# Patient Record
Sex: Female | Born: 1985 | Race: White | Hispanic: No | Marital: Married | Smoking: Never smoker
Health system: Southern US, Community
[De-identification: ages and names within clinical notes are randomized; demographics above are authoritative.]

## PROBLEM LIST (undated history)

## (undated) DIAGNOSIS — T7840XA Allergy, unspecified, initial encounter: Secondary | ICD-10-CM

## (undated) DIAGNOSIS — H469 Unspecified optic neuritis: Secondary | ICD-10-CM

## (undated) DIAGNOSIS — F419 Anxiety disorder, unspecified: Secondary | ICD-10-CM

## (undated) DIAGNOSIS — G709 Myoneural disorder, unspecified: Secondary | ICD-10-CM

## (undated) HISTORY — PX: TONSILLECTOMY AND ADENOIDECTOMY: SUR1326

## (undated) HISTORY — DX: Allergy, unspecified, initial encounter: T78.40XA

## (undated) HISTORY — PX: WISDOM TOOTH EXTRACTION: SHX21

## (undated) HISTORY — DX: Unspecified optic neuritis: H46.9

## (undated) HISTORY — DX: Myoneural disorder, unspecified: G70.9

## (undated) HISTORY — DX: Anxiety disorder, unspecified: F41.9

---

## 2011-10-21 DIAGNOSIS — D66 Hereditary factor VIII deficiency: Secondary | ICD-10-CM | POA: Insufficient documentation

## 2015-09-23 DIAGNOSIS — Z8669 Personal history of other diseases of the nervous system and sense organs: Secondary | ICD-10-CM | POA: Insufficient documentation

## 2017-08-15 LAB — CBC AND DIFFERENTIAL
HCT: 42 (ref 36–46)
Hemoglobin: 14.4 (ref 12.0–16.0)
Platelets: 213 (ref 150–399)
WBC: 5.7

## 2017-08-15 LAB — BASIC METABOLIC PANEL
BUN: 10 (ref 4–21)
CO2: 27 — AB (ref 13–22)
Chloride: 105 (ref 99–108)
Creatinine: 0.6 (ref 0.5–1.1)
Glucose: 87
Potassium: 3.8 (ref 3.4–5.3)
Sodium: 140 (ref 137–147)

## 2017-08-15 LAB — HEPATIC FUNCTION PANEL
ALT: 16 (ref 7–35)
AST: 19 (ref 13–35)
Alkaline Phosphatase: 75 (ref 25–125)
Bilirubin, Total: 0.6

## 2017-08-15 LAB — LIPID PANEL
Cholesterol: 137 (ref 0–200)
HDL: 43 (ref 35–70)
LDL Cholesterol: 84
Triglycerides: 49 (ref 40–160)

## 2017-08-15 LAB — TSH: TSH: 1.25 (ref 0.41–5.90)

## 2017-08-15 LAB — COMPREHENSIVE METABOLIC PANEL: Calcium: 8.7 (ref 8.7–10.7)

## 2017-08-15 LAB — CBC: RBC: 4.62 (ref 3.87–5.11)

## 2019-01-02 ENCOUNTER — Ambulatory Visit (INDEPENDENT_AMBULATORY_CARE_PROVIDER_SITE_OTHER): Payer: 59 | Admitting: Adult Health

## 2019-01-02 ENCOUNTER — Other Ambulatory Visit: Payer: Self-pay

## 2019-01-02 ENCOUNTER — Encounter: Payer: Self-pay | Admitting: Adult Health

## 2019-01-02 VITALS — BP 117/79 | HR 70 | Temp 98.8°F | Ht 62.5 in | Wt 182.0 lb

## 2019-01-02 DIAGNOSIS — Z87898 Personal history of other specified conditions: Secondary | ICD-10-CM | POA: Diagnosis not present

## 2019-01-02 DIAGNOSIS — Z0000 Encounter for general adult medical examination without abnormal findings: Secondary | ICD-10-CM | POA: Diagnosis not present

## 2019-01-02 DIAGNOSIS — Z6832 Body mass index (BMI) 32.0-32.9, adult: Secondary | ICD-10-CM

## 2019-01-02 NOTE — Assessment & Plan Note (Signed)
Increase water intake, strive for at least 90 ounces/day.   Follow Heart Healthy diet Increase regular exercise.  Recommend at least 30 minutes daily, 5 days per week of walking, jogging, biking, swimming, YouTube/[employer] workout videos. Referral to OB/GYN placed. Referral to Sleep Study placed. Please schedule fasting lab appt in the next 2 weeks. Recommend annual physical.

## 2019-01-02 NOTE — Patient Instructions (Signed)
Preventive Care for Adults, Female  A healthy lifestyle and preventive care can promote health and wellness. Preventive health guidelines for women include the following key practices.   A routine yearly physical is a good way to check with your health care provider about your health and preventive screening. It is a chance to share any concerns and updates on your health and to receive a thorough exam.   Visit your dentist for a routine exam and preventive care every 6 months. Brush your teeth twice a day and floss once a day. Good oral hygiene prevents tooth decay and gum disease.   The frequency of eye exams is based on your age, health, family medical history, use of contact lenses, and other factors. Follow your health care provider's recommendations for frequency of eye exams.   Eat a healthy diet. Foods like vegetables, fruits, whole grains, low-fat dairy products, and lean protein foods contain the nutrients you need without too many calories. Decrease your intake of foods high in solid fats, added sugars, and salt. Eat the right amount of calories for you.Get information about a proper diet from your health care provider, if necessary.   Regular physical exercise is one of the most important things you can do for your health. Most adults should get at least 150 minutes of moderate-intensity exercise (any activity that increases your heart rate and causes you to sweat) each week. In addition, most adults need muscle-strengthening exercises on 2 or more days a week.   Maintain a healthy weight. The body mass index (BMI) is a screening tool to identify possible weight problems. It provides an estimate of body fat based on height and weight. Your health care provider can find your BMI, and can help you achieve or maintain a healthy weight.For adults 20 years and older:   - A BMI below 18.5 is considered underweight.   - A BMI of 18.5 to 24.9 is normal.   - A BMI of 25 to 29.9 is  considered overweight.   - A BMI of 30 and above is considered obese.   Maintain normal blood lipids and cholesterol levels by exercising and minimizing your intake of trans and saturated fats.  Eat a balanced diet with plenty of fruit and vegetables. Blood tests for lipids and cholesterol should begin at age 20 and be repeated every 5 years minimum.  If your lipid or cholesterol levels are high, you are over 40, or you are at high risk for heart disease, you may need your cholesterol levels checked more frequently.Ongoing high lipid and cholesterol levels should be treated with medicines if diet and exercise are not working.   If you smoke, find out from your health care provider how to quit. If you do not use tobacco, do not start.   Lung cancer screening is recommended for adults aged 55-80 years who are at high risk for developing lung cancer because of a history of smoking. A yearly low-dose CT scan of the lungs is recommended for people who have at least a 30-pack-year history of smoking and are a current smoker or have quit within the past 15 years. A pack year of smoking is smoking an average of 1 pack of cigarettes a day for 1 year (for example: 1 pack a day for 30 years or 2 packs a day for 15 years). Yearly screening should continue until the smoker has stopped smoking for at least 15 years. Yearly screening should be stopped for people who develop a   health problem that would prevent them from having lung cancer treatment.   If you are pregnant, do not drink alcohol. If you are breastfeeding, be very cautious about drinking alcohol. If you are not pregnant and choose to drink alcohol, do not have more than 1 drink per day. One drink is considered to be 12 ounces (355 mL) of beer, 5 ounces (148 mL) of wine, or 1.5 ounces (44 mL) of liquor.   Avoid use of street drugs. Do not share needles with anyone. Ask for help if you need support or instructions about stopping the use of  drugs.   High blood pressure causes heart disease and increases the risk of stroke. Your blood pressure should be checked at least yearly.  Ongoing high blood pressure should be treated with medicines if weight loss and exercise do not work.   If you are 69-55 years old, ask your health care provider if you should take aspirin to prevent strokes.   Diabetes screening involves taking a blood sample to check your fasting blood sugar level. This should be done once every 3 years, after age 38, if you are within normal weight and without risk factors for diabetes. Testing should be considered at a younger age or be carried out more frequently if you are overweight and have at least 1 risk factor for diabetes.   Breast cancer screening is essential preventive care for women. You should practice "breast self-awareness."  This means understanding the normal appearance and feel of your breasts and may include breast self-examination.  Any changes detected, no matter how small, should be reported to a health care provider.  Women in their 80s and 30s should have a clinical breast exam (CBE) by a health care provider as part of a regular health exam every 1 to 3 years.  After age 66, women should have a CBE every year.  Starting at age 1, women should consider having a mammogram (breast X-ray test) every year.  Women who have a family history of breast cancer should talk to their health care provider about genetic screening.  Women at a high risk of breast cancer should talk to their health care providers about having an MRI and a mammogram every year.   -Breast cancer gene (BRCA)-related cancer risk assessment is recommended for women who have family members with BRCA-related cancers. BRCA-related cancers include breast, ovarian, tubal, and peritoneal cancers. Having family members with these cancers may be associated with an increased risk for harmful changes (mutations) in the breast cancer genes BRCA1 and  BRCA2. Results of the assessment will determine the need for genetic counseling and BRCA1 and BRCA2 testing.   The Pap test is a screening test for cervical cancer. A Pap test can show cell changes on the cervix that might become cervical cancer if left untreated. A Pap test is a procedure in which cells are obtained and examined from the lower end of the uterus (cervix).   - Women should have a Pap test starting at age 57.   - Between ages 90 and 70, Pap tests should be repeated every 2 years.   - Beginning at age 63, you should have a Pap test every 3 years as long as the past 3 Pap tests have been normal.   - Some women have medical problems that increase the chance of getting cervical cancer. Talk to your health care provider about these problems. It is especially important to talk to your health care provider if a  new problem develops soon after your last Pap test. In these cases, your health care provider may recommend more frequent screening and Pap tests.   - The above recommendations are the same for women who have or have not gotten the vaccine for human papillomavirus (HPV).   - If you had a hysterectomy for a problem that was not cancer or a condition that could lead to cancer, then you no longer need Pap tests. Even if you no longer need a Pap test, a regular exam is a good idea to make sure no other problems are starting.   - If you are between ages 36 and 66 years, and you have had normal Pap tests going back 10 years, you no longer need Pap tests. Even if you no longer need a Pap test, a regular exam is a good idea to make sure no other problems are starting.   - If you have had past treatment for cervical cancer or a condition that could lead to cancer, you need Pap tests and screening for cancer for at least 20 years after your treatment.   - If Pap tests have been discontinued, risk factors (such as a new sexual partner) need to be reassessed to determine if screening should  be resumed.   - The HPV test is an additional test that may be used for cervical cancer screening. The HPV test looks for the virus that can cause the cell changes on the cervix. The cells collected during the Pap test can be tested for HPV. The HPV test could be used to screen women aged 70 years and older, and should be used in women of any age who have unclear Pap test results. After the age of 67, women should have HPV testing at the same frequency as a Pap test.   Colorectal cancer can be detected and often prevented. Most routine colorectal cancer screening begins at the age of 57 years and continues through age 26 years. However, your health care provider may recommend screening at an earlier age if you have risk factors for colon cancer. On a yearly basis, your health care provider may provide home test kits to check for hidden blood in the stool.  Use of a small camera at the end of a tube, to directly examine the colon (sigmoidoscopy or colonoscopy), can detect the earliest forms of colorectal cancer. Talk to your health care provider about this at age 23, when routine screening begins. Direct exam of the colon should be repeated every 5 -10 years through age 49 years, unless early forms of pre-cancerous polyps or small growths are found.   People who are at an increased risk for hepatitis B should be screened for this virus. You are considered at high risk for hepatitis B if:  -You were born in a country where hepatitis B occurs often. Talk with your health care provider about which countries are considered high risk.  - Your parents were born in a high-risk country and you have not received a shot to protect against hepatitis B (hepatitis B vaccine).  - You have HIV or AIDS.  - You use needles to inject street drugs.  - You live with, or have sex with, someone who has Hepatitis B.  - You get hemodialysis treatment.  - You take certain medicines for conditions like cancer, organ  transplantation, and autoimmune conditions.   Hepatitis C blood testing is recommended for all people born from [birth year] through 1965 and any individual  with known risks for hepatitis C.   Practice safe sex. Use condoms and avoid high-risk sexual practices to reduce the spread of sexually transmitted infections (STIs). STIs include gonorrhea, chlamydia, syphilis, trichomonas, herpes, HPV, and human immunodeficiency virus (HIV). Herpes, HIV, and HPV are viral illnesses that have no cure. They can result in disability, cancer, and death. Sexually active women aged 25 years and younger should be checked for chlamydia. Older women with new or multiple partners should also be tested for chlamydia. Testing for other STIs is recommended if you are sexually active and at increased risk.   Osteoporosis is a disease in which the bones lose minerals and strength with aging. This can result in serious bone fractures or breaks. The risk of osteoporosis can be identified using a bone density scan. Women ages 65 years and over and women at risk for fractures or osteoporosis should discuss screening with their health care providers. Ask your health care provider whether you should take a calcium supplement or vitamin D to There are also several preventive steps women can take to avoid osteoporosis and resulting fractures or to keep osteoporosis from worsening. -->Recommendations include:  Eat a balanced diet high in fruits, vegetables, calcium, and vitamins.  Get enough calcium. The recommended total intake of is 1,200 mg daily; for best absorption, if taking supplements, divide doses into 250-500 mg doses throughout the day. Of the two types of calcium, calcium carbonate is best absorbed when taken with food but calcium citrate can be taken on an empty stomach.  Get enough vitamin D. NAMS and the National Osteoporosis Foundation recommend at least 1,000 IU per day for women age 50 and over who are at risk of vitamin D  deficiency. Vitamin D deficiency can be caused by inadequate sun exposure (for example, those who live in northern latitudes).  Avoid alcohol and smoking. Heavy alcohol intake (more than 7 drinks per week) increases the risk of falls and hip fracture and women smokers tend to lose bone more rapidly and have lower bone mass than nonsmokers. Stopping smoking is one of the most important changes women can make to improve their health and decrease risk for disease.  Be physically active every day. Weight-bearing exercise (for example, fast walking, hiking, jogging, and weight training) may strengthen bones or slow the rate of bone loss that comes with aging. Balancing and muscle-strengthening exercises can reduce the risk of falling and fracture.  Consider therapeutic medications. Currently, several types of effective drugs are available. Healthcare providers can recommend the type most appropriate for each woman.  Eliminate environmental factors that may contribute to accidents. Falls cause nearly 90% of all osteoporotic fractures, so reducing this risk is an important bone-health strategy. Measures include ample lighting, removing obstructions to walking, using nonskid rugs on floors, and placing mats and/or grab bars in showers.  Be aware of medication side effects. Some common medicines make bones weaker. These include a type of steroid drug called glucocorticoids used for arthritis and asthma, some antiseizure drugs, certain sleeping pills, treatments for endometriosis, and some cancer drugs. An overactive thyroid gland or using too much thyroid hormone for an underactive thyroid can also be a problem. If you are taking these medicines, talk to your doctor about what you can do to help protect your bones.reduce the rate of osteoporosis.    Menopause can be associated with physical symptoms and risks. Hormone replacement therapy is available to decrease symptoms and risks. You should talk to your  health care provider   about whether hormone replacement therapy is right for you.   Use sunscreen. Apply sunscreen liberally and repeatedly throughout the day. You should seek shade when your shadow is shorter than you. Protect yourself by wearing long sleeves, pants, a wide-brimmed hat, and sunglasses year round, whenever you are outdoors.   Once a month, do a whole body skin exam, using a mirror to look at the skin on your back. Tell your health care provider of new moles, moles that have irregular borders, moles that are larger than a pencil eraser, or moles that have changed in shape or color.   -Stay current with required vaccines (immunizations).   Influenza vaccine. All adults should be immunized every year.  Tetanus, diphtheria, and acellular pertussis (Td, Tdap) vaccine. Pregnant women should receive 1 dose of Tdap vaccine during each pregnancy. The dose should be obtained regardless of the length of time since the last dose. Immunization is preferred during the 27th 36th week of gestation. An adult who has not previously received Tdap or who does not know her vaccine status should receive 1 dose of Tdap. This initial dose should be followed by tetanus and diphtheria toxoids (Td) booster doses every 10 years. Adults with an unknown or incomplete history of completing a 3-dose immunization series with Td-containing vaccines should begin or complete a primary immunization series including a Tdap dose. Adults should receive a Td booster every 10 years.  Varicella vaccine. An adult without evidence of immunity to varicella should receive 2 doses or a second dose if she has previously received 1 dose. Pregnant females who do not have evidence of immunity should receive the first dose after pregnancy. This first dose should be obtained before leaving the health care facility. The second dose should be obtained 4 8 weeks after the first dose.  Human papillomavirus (HPV) vaccine. Females aged 13 26  years who have not received the vaccine previously should obtain the 3-dose series. The vaccine is not recommended for use in pregnant females. However, pregnancy testing is not needed before receiving a dose. If a female is found to be pregnant after receiving a dose, no treatment is needed. In that case, the remaining doses should be delayed until after the pregnancy. Immunization is recommended for any person with an immunocompromised condition through the age of 26 years if she did not get any or all doses earlier. During the 3-dose series, the second dose should be obtained 4 8 weeks after the first dose. The third dose should be obtained 24 weeks after the first dose and 16 weeks after the second dose.  Zoster vaccine. One dose is recommended for adults aged 60 years or older unless certain conditions are present.  Measles, mumps, and rubella (MMR) vaccine. Adults born before 1957 generally are considered immune to measles and mumps. Adults born in 1957 or later should have 1 or more doses of MMR vaccine unless there is a contraindication to the vaccine or there is laboratory evidence of immunity to each of the three diseases. A routine second dose of MMR vaccine should be obtained at least 28 days after the first dose for students attending postsecondary schools, health care workers, or international travelers. People who received inactivated measles vaccine or an unknown type of measles vaccine during 1963 1967 should receive 2 doses of MMR vaccine. People who received inactivated mumps vaccine or an unknown type of mumps vaccine before 1979 and are at high risk for mumps infection should consider immunization with 2 doses of   MMR vaccine. For females of childbearing age, rubella immunity should be determined. If there is no evidence of immunity, females who are not pregnant should be vaccinated. If there is no evidence of immunity, females who are pregnant should delay immunization until after pregnancy.  Unvaccinated health care workers born before [birth year] who lack laboratory evidence of measles, mumps, or rubella immunity or laboratory confirmation of disease should consider measles and mumps immunization with 2 doses of MMR vaccine or rubella immunization with 1 dose of MMR vaccine.  Pneumococcal 13-valent conjugate (PCV13) vaccine. When indicated, a person who is uncertain of her immunization history and has no record of immunization should receive the PCV13 vaccine. An adult aged 54 years or older who has certain medical conditions and has not been previously immunized should receive 1 dose of PCV13 vaccine. This PCV13 should be followed with a dose of pneumococcal polysaccharide (PPSV23) vaccine. The PPSV23 vaccine dose should be obtained at least 8 weeks after the dose of PCV13 vaccine. An adult aged 58 years or older who has certain medical conditions and previously received 1 or more doses of PPSV23 vaccine should receive 1 dose of PCV13. The PCV13 vaccine dose should be obtained 1 or more years after the last PPSV23 vaccine dose.  Pneumococcal polysaccharide (PPSV23) vaccine. When PCV13 is also indicated, PCV13 should be obtained first. All adults aged 58 years and older should be immunized. An adult younger than age 65 years who has certain medical conditions should be immunized. Any person who resides in a nursing home or long-term care facility should be immunized. An adult smoker should be immunized. People with an immunocompromised condition and certain other conditions should receive both PCV13 and PPSV23 vaccines. People with human immunodeficiency virus (HIV) infection should be immunized as soon as possible after diagnosis. Immunization during chemotherapy or radiation therapy should be avoided. Routine use of PPSV23 vaccine is not recommended for American Indians, [place] Natives, or people younger than 65 years unless there are medical conditions that require PPSV23 vaccine. When indicated,  people who have unknown immunization and have no record of immunization should receive PPSV23 vaccine. One-time revaccination 5 years after the first dose of PPSV23 is recommended for people aged 70 64 years who have chronic kidney failure, nephrotic syndrome, asplenia, or immunocompromised conditions. People who received 1 2 doses of PPSV23 before age 32 years should receive another dose of PPSV23 vaccine at age 96 years or later if at least 5 years have passed since the previous dose. Doses of PPSV23 are not needed for people immunized with PPSV23 at or after age 55 years.  Meningococcal vaccine. Adults with asplenia or persistent complement component deficiencies should receive 2 doses of quadrivalent meningococcal conjugate (MenACWY-D) vaccine. The doses should be obtained at least 2 months apart. Microbiologists working with certain meningococcal bacteria, [employer] recruits, people at risk during an outbreak, and people who travel to or live in countries with a high rate of meningitis should be immunized. A first-year college student up through age 58 years who is living in a residence hall should receive a dose if she did not receive a dose on or after her 16th birthday. Adults who have certain high-risk conditions should receive one or more doses of vaccine.  Hepatitis A vaccine. Adults who wish to be protected from this disease, have certain high-risk conditions, work with hepatitis A-infected animals, work in hepatitis A research labs, or travel to or work in countries with a high rate of hepatitis A should be  immunized. Adults who were previously unvaccinated and who anticipate close contact with an international adoptee during the first 60 days after arrival in the [place] from a country with a high rate of hepatitis A should be immunized.  Hepatitis B vaccine.  Adults who wish to be protected from this disease, have certain high-risk conditions, may be exposed to blood or other infectious  body fluids, are household contacts or sex partners of hepatitis B positive people, are clients or workers in certain care facilities, or travel to or work in countries with a high rate of hepatitis B should be immunized.  Haemophilus influenzae type b (Hib) vaccine. A previously unvaccinated person with asplenia or sickle cell disease or having a scheduled splenectomy should receive 1 dose of Hib vaccine. Regardless of previous immunization, a recipient of a hematopoietic stem cell transplant should receive a 3-dose series 6 12 months after her successful transplant. Hib vaccine is not recommended for adults with HIV infection.  Preventive Services / Frequency Ages 6 to 39years  Blood pressure check.** / Every 1 to 2 years.  Lipid and cholesterol check.** / Every 5 years beginning at age 39.  Clinical breast exam.** / Every 3 years for women in their 61s and 62s.  BRCA-related cancer risk assessment.** / For women who have family members with a BRCA-related cancer (breast, ovarian, tubal, or peritoneal cancers).  Pap test.** / Every 2 years from ages 47 through 85. Every 3 years starting at age 34 through age 12 or 74 with a history of 3 consecutive normal Pap tests.  HPV screening.** / Every 3 years from ages 46 through ages 43 to 54 with a history of 3 consecutive normal Pap tests.  Hepatitis C blood test.** / For any individual with known risks for hepatitis C.  Skin self-exam. / Monthly.  Influenza vaccine. / Every year.  Tetanus, diphtheria, and acellular pertussis (Tdap, Td) vaccine.** / Consult your health care provider. Pregnant women should receive 1 dose of Tdap vaccine during each pregnancy. 1 dose of Td every 10 years.  Varicella vaccine.** / Consult your health care provider. Pregnant females who do not have evidence of immunity should receive the first dose after pregnancy.  HPV vaccine. / 3 doses over 6 months, if 64 and younger. The vaccine is not recommended for use in  pregnant females. However, pregnancy testing is not needed before receiving a dose.  Measles, mumps, rubella (MMR) vaccine.** / You need at least 1 dose of MMR if you were born in 1957 or later. You may also need a 2nd dose. For females of childbearing age, rubella immunity should be determined. If there is no evidence of immunity, females who are not pregnant should be vaccinated. If there is no evidence of immunity, females who are pregnant should delay immunization until after pregnancy.  Pneumococcal 13-valent conjugate (PCV13) vaccine.** / Consult your health care provider.  Pneumococcal polysaccharide (PPSV23) vaccine.** / 1 to 2 doses if you smoke cigarettes or if you have certain conditions.  Meningococcal vaccine.** / 1 dose if you are age 71 to 39 years and a Market researcher living in a residence hall, or have one of several medical conditions, you need to get vaccinated against meningococcal disease. You may also need additional booster doses.  Hepatitis A vaccine.** / Consult your health care provider.  Hepatitis B vaccine.** / Consult your health care provider.  Haemophilus influenzae type b (Hib) vaccine.** / Consult your health care provider.  Ages 55 to 64years  Blood pressure check.** / Every 1 to 2 years.  Lipid and cholesterol check.** / Every 5 years beginning at age 20 years.  Lung cancer screening. / Every year if you are aged 55 80 years and have a 30-pack-year history of smoking and currently smoke or have quit within the past 15 years. Yearly screening is stopped once you have quit smoking for at least 15 years or develop a health problem that would prevent you from having lung cancer treatment.  Clinical breast exam.** / Every year after age 40 years.  BRCA-related cancer risk assessment.** / For women who have family members with a BRCA-related cancer (breast, ovarian, tubal, or peritoneal cancers).  Mammogram.** / Every year beginning at age 40  years and continuing for as long as you are in good health. Consult with your health care provider.  Pap test.** / Every 3 years starting at age 30 years through age 65 or 70 years with a history of 3 consecutive normal Pap tests.  HPV screening.** / Every 3 years from ages 30 years through ages 65 to 70 years with a history of 3 consecutive normal Pap tests.  Fecal occult blood test (FOBT) of stool. / Every year beginning at age 50 years and continuing until age 75 years. You may not need to do this test if you get a colonoscopy every 10 years.  Flexible sigmoidoscopy or colonoscopy.** / Every 5 years for a flexible sigmoidoscopy or every 10 years for a colonoscopy beginning at age 50 years and continuing until age 75 years.  Hepatitis C blood test.** / For all people born from 1945 through 1965 and any individual with known risks for hepatitis C.  Skin self-exam. / Monthly.  Influenza vaccine. / Every year.  Tetanus, diphtheria, and acellular pertussis (Tdap/Td) vaccine.** / Consult your health care provider. Pregnant women should receive 1 dose of Tdap vaccine during each pregnancy. 1 dose of Td every 10 years.  Varicella vaccine.** / Consult your health care provider. Pregnant females who do not have evidence of immunity should receive the first dose after pregnancy.  Zoster vaccine.** / 1 dose for adults aged 60 years or older.  Measles, mumps, rubella (MMR) vaccine.** / You need at least 1 dose of MMR if you were born in 1957 or later. You may also need a 2nd dose. For females of childbearing age, rubella immunity should be determined. If there is no evidence of immunity, females who are not pregnant should be vaccinated. If there is no evidence of immunity, females who are pregnant should delay immunization until after pregnancy.  Pneumococcal 13-valent conjugate (PCV13) vaccine.** / Consult your health care provider.  Pneumococcal polysaccharide (PPSV23) vaccine.** / 1 to 2 doses if  you smoke cigarettes or if you have certain conditions.  Meningococcal vaccine.** / Consult your health care provider.  Hepatitis A vaccine.** / Consult your health care provider.  Hepatitis B vaccine.** / Consult your health care provider.  Haemophilus influenzae type b (Hib) vaccine.** / Consult your health care provider.  Ages 65 years and over  Blood pressure check.** / Every 1 to 2 years.  Lipid and cholesterol check.** / Every 5 years beginning at age 20 years.  Lung cancer screening. / Every year if you are aged 55 80 years and have a 30-pack-year history of smoking and currently smoke or have quit within the past 15 years. Yearly screening is stopped once you have quit smoking for at least 15 years or develop a health problem that   would prevent you from having lung cancer treatment.  Clinical breast exam.** / Every year after age 103 years.  BRCA-related cancer risk assessment.** / For women who have family members with a BRCA-related cancer (breast, ovarian, tubal, or peritoneal cancers).  Mammogram.** / Every year beginning at age 36 years and continuing for as long as you are in good health. Consult with your health care provider.  Pap test.** / Every 3 years starting at age 5 years through age 85 or 10 years with 3 consecutive normal Pap tests. Testing can be stopped between 65 and 70 years with 3 consecutive normal Pap tests and no abnormal Pap or HPV tests in the past 10 years.  HPV screening.** / Every 3 years from ages 93 years through ages 70 or 45 years with a history of 3 consecutive normal Pap tests. Testing can be stopped between 65 and 70 years with 3 consecutive normal Pap tests and no abnormal Pap or HPV tests in the past 10 years.  Fecal occult blood test (FOBT) of stool. / Every year beginning at age 8 years and continuing until age 45 years. You may not need to do this test if you get a colonoscopy every 10 years.  Flexible sigmoidoscopy or colonoscopy.** /  Every 5 years for a flexible sigmoidoscopy or every 10 years for a colonoscopy beginning at age 69 years and continuing until age 68 years.  Hepatitis C blood test.** / For all people born from [birth year] through 1963 and any individual with known risks for hepatitis C.  Osteoporosis screening.** / A one-time screening for women ages 7 years and over and women at risk for fractures or osteoporosis.  Skin self-exam. / Monthly.  Influenza vaccine. / Every year.  Tetanus, diphtheria, and acellular pertussis (Tdap/Td) vaccine.** / 1 dose of Td every 10 years.  Varicella vaccine.** / Consult your health care provider.  Zoster vaccine.** / 1 dose for adults aged 5 years or older.  Pneumococcal 13-valent conjugate (PCV13) vaccine.** / Consult your health care provider.  Pneumococcal polysaccharide (PPSV23) vaccine.** / 1 dose for all adults aged 74 years and older.  Meningococcal vaccine.** / Consult your health care provider.  Hepatitis A vaccine.** / Consult your health care provider.  Hepatitis B vaccine.** / Consult your health care provider.  Haemophilus influenzae type b (Hib) vaccine.** / Consult your health care provider. ** Family history and personal history of risk and conditions may change your health care provider's recommendations. Document Released: 04/05/2001 Document Revised: 11/28/2012  [facility] Patient Information 2014 [place], [place].   EXERCISE AND DIET:  We recommended that you start or continue a regular exercise program for good health. Regular exercise means any activity that makes your heart beat faster and makes you sweat.  We recommend exercising at least 30 minutes per day at least 3 days a week, preferably 5.  We also recommend a diet low in fat and sugar / carbohydrates.  Inactivity, poor dietary choices and obesity can cause diabetes, heart attack, stroke, and kidney damage, among others.     ALCOHOL AND SMOKING:  Women should limit their alcohol intake to no  more than 7 drinks/beers/glasses of wine (combined, not each!) per week. Moderation of alcohol intake to this level decreases your risk of breast cancer and liver damage.  ( And of course, no recreational drugs are part of a healthy lifestyle.)  Also, you should not be smoking at all or even being exposed to second hand smoke. Most people know smoking can  cause cancer, and various heart and lung diseases, but did you know it also contributes to weakening of your bones?  Aging of your skin?  Yellowing of your teeth and nails?   CALCIUM AND VITAMIN D:  Adequate intake of calcium and Vitamin D are recommended.  The recommendations for exact amounts of these supplements seem to change often, but generally speaking 600 mg of calcium (either carbonate or citrate) and 800 units of Vitamin D per day seems prudent. Certain women may benefit from higher intake of Vitamin D.  If you are among these women, your doctor will have told you during your visit.     PAP SMEARS:  Pap smears, to check for cervical cancer or precancers,  have traditionally been done yearly, although recent scientific advances have shown that most women can have pap smears less often.  However, every woman still should have a physical exam from her gynecologist or primary care physician every year. It will include a breast check, inspection of the vulva and vagina to check for abnormal growths or skin changes, a visual exam of the cervix, and then an exam to evaluate the size and shape of the uterus and ovaries.  And after 31 years of age, a rectal exam is indicated to check for rectal cancers. We will also provide age appropriate advice regarding health maintenance, like when you should have certain vaccines, screening for sexually transmitted diseases, bone density testing, colonoscopy, mammograms, etc.    MAMMOGRAMS:  All women over 32 years old should have a yearly mammogram. Many facilities now offer a "3D" mammogram, which may cost  around $50 extra out of pocket. If possible,  we recommend you accept the option to have the 3D mammogram performed.  It both reduces the number of women who will be called back for extra views which then turn out to be normal, and it is better than the routine mammogram at detecting truly abnormal areas.     COLONOSCOPY:  Colonoscopy to screen for colon cancer is recommended for all women at age 55.  We know, you hate the idea of the prep.  We agree, BUT, having colon cancer and not knowing it is worse!!  Colon cancer so often starts as a polyp that can be seen and removed at colonscopy, which can quite literally save your life!  And if your first colonoscopy is normal and you have no family history of colon cancer, most women don't have to have it again for 10 years.  Once every ten years, you can do something that may end up saving your life, right?  We will be happy to help you get it scheduled when you are ready.  Be sure to check your insurance coverage so you understand how much it will cost.  It may be covered as a preventative service at no cost, but you should check your particular policy.   Increase water intake, strive for at least 90 ounces/day.   Follow Heart Healthy diet Increase regular exercise.  Recommend at least 30 minutes daily, 5 days per week of walking, jogging, biking, swimming, YouTube/Pinterest workout videos. Referral to OB/GYN placed. Referral to Sleep Study placed. Please schedule fasting lab appt in the next 2 weeks. Recommend annual physical. WELCOME TO THE PRACTICE!

## 2019-01-02 NOTE — Progress Notes (Signed)
Subjective:    Patient ID: Penny Cooper, female    DOB: [birth year], 33 y.o.   MRN: [id number]  HPI:  Penny Cooper is here to establish as a new pt and complete CPE.  She is a pleasant 33 year old female. 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 She recently moved from [place] - here to establish with Primary Care, also needs local OB/GYN Her mother overdosed from opiates 9 years ago. She has never had Sleep Study- agreeable to referral. She is a Charity fundraiser for local hospital. She is married with 3 children, denies formal exercise, however "runs around all day". She denies tobacco/vape/ETOH use. She has remote hx of HA/dizziness- none at present.  Patient Care Team    Relationship Specialty Notifications Start End  Julaine Fusi, NP PCP - General Family Medicine  01/02/19     Patient Active Problem List   Diagnosis Date Noted  . History of snoring 01/03/2019  . BMI 32.0-32.9,adult 01/03/2019  . Healthcare maintenance 01/02/2019     Past Medical History:  Diagnosis Date  . Optic neuritis      Past Surgical History:  Procedure Laterality Date  . TONSILLECTOMY AND ADENOIDECTOMY    . WISDOM TOOTH EXTRACTION       Family History  Problem Relation Age of Onset  . Diabetes Maternal Grandmother   . Hypertension Maternal Grandmother   . Cancer Maternal Grandfather   . Heart attack Maternal Grandfather   . Hypertension Paternal Grandfather   . Stroke Paternal Grandfather      Social History   Substance and Sexual Activity  Drug Use Never     Social History   Substance and Sexual Activity  Alcohol Use Yes   Comment: occassionally     Social History   Tobacco Use  Smoking Status Never Smoker  Smokeless Tobacco Never Used     No outpatient encounter medications on file as of 01/02/2019.   No facility-administered encounter medications on  file as of 01/02/2019.     Allergies: Mango flavor  Body mass index is 32.76 kg/m.  Blood pressure 117/79, pulse 70, temperature 98.8 F (37.1 C), temperature source Oral, height 5' 2.5" (1.588 m), weight 182 lb (82.6 kg), last menstrual period 01/02/2019, SpO2 97 %.  Review of Systems  Constitutional: Positive for fatigue. Negative for activity change, appetite change, chills, diaphoresis, fever and unexpected weight change.  HENT: Negative for congestion.   Eyes: Negative for visual disturbance.  Respiratory: Negative for cough, chest tightness, shortness of breath, wheezing and stridor.   Cardiovascular: Negative for chest pain, palpitations and leg swelling.  Gastrointestinal: Negative for abdominal distention, anal bleeding, blood in stool, constipation, diarrhea, nausea and vomiting.  Genitourinary: Negative for difficulty urinating and flank pain.  Musculoskeletal: Negative for arthralgias, gait problem, joint swelling and myalgias.  Neurological: Negative for dizziness and headaches.  Hematological: Negative for adenopathy. Does not bruise/bleed easily.  Psychiatric/Behavioral: Negative for suicidal ideas.       Objective:   Physical Exam Vitals signs and nursing note reviewed.  Constitutional:      General: She is not in acute distress.    Appearance: She is not ill-appearing, toxic-appearing or diaphoretic.  HENT:     Head: Normocephalic and atraumatic.     Right Ear: Tympanic membrane, ear canal and external ear normal. There is no impacted cerumen.     Left Ear: Tympanic  membrane, ear canal and external ear normal. There is no impacted cerumen.     Nose: Nose normal. No congestion.     Mouth/Throat:     Mouth: Mucous membranes are moist.     Pharynx: No oropharyngeal exudate or posterior oropharyngeal erythema.  Eyes:     Conjunctiva/sclera: Conjunctivae normal.     Pupils: Pupils are equal, round, and reactive to light.  Neck:     Musculoskeletal: Normal  range of motion and neck supple. No muscular tenderness.  Cardiovascular:     Rate and Rhythm: Normal rate and regular rhythm.     Pulses: Normal pulses.     Heart sounds: Normal heart sounds. No murmur. No friction rub. No gallop.   Pulmonary:     Effort: Pulmonary effort is normal. No respiratory distress.     Breath sounds: Normal breath sounds. No stridor. No wheezing, rhonchi or rales.  Chest:     Chest wall: No tenderness.  Abdominal:     General: Abdomen is protuberant. Bowel sounds are normal. There is no distension.     Palpations: Abdomen is soft. There is no mass.     Tenderness: There is no abdominal tenderness. There is no right CVA tenderness or left CVA tenderness.  Musculoskeletal: Normal range of motion.        General: No tenderness.  Skin:    General: Skin is warm and dry.     Capillary Refill: Capillary refill takes less than 2 seconds.  Neurological:     Mental Status: She is alert and oriented to person, place, and time.     Coordination: Coordination normal.  Psychiatric:        Mood and Affect: Mood normal.        Behavior: Behavior normal.        Thought Content: Thought content normal.        Judgment: Judgment normal.       Assessment & Plan:   1. Healthcare maintenance   2. History of snoring   3. BMI 32.0-32.9,adult     Healthcare maintenance Increase water intake, strive for at least 90 ounces/day.   Follow Heart Healthy diet Increase regular exercise.  Recommend at least 30 minutes daily, 5 days per week of walking, jogging, biking, swimming, YouTube/Pinterest workout videos. Referral to OB/GYN placed. Referral to Sleep Study placed. Please schedule fasting lab appt in the next 2 weeks. Recommend annual physical.  History of snoring Sleep Study ordered  BMI 32.0-32.9,adult Mediterranean diet Increase regular exercise, increase daily water intake. Current wt 182 Body mass index is 32.76 kg/m.    FOLLOW-UP:  Return in about 1  year (around 01/02/2020) for CPE, Fasting Labs.

## 2019-01-03 ENCOUNTER — Encounter: Payer: Self-pay | Admitting: Obstetrics and Gynecology

## 2019-01-03 DIAGNOSIS — Z6832 Body mass index (BMI) 32.0-32.9, adult: Secondary | ICD-10-CM | POA: Insufficient documentation

## 2019-01-03 DIAGNOSIS — Z87898 Personal history of other specified conditions: Secondary | ICD-10-CM | POA: Insufficient documentation

## 2019-01-03 NOTE — Assessment & Plan Note (Signed)
Mediterranean diet Increase regular exercise, increase daily water intake. Current wt 182 Body mass index is 32.76 kg/m.

## 2019-01-03 NOTE — Assessment & Plan Note (Signed)
Sleep Study ordered.

## 2019-01-11 ENCOUNTER — Other Ambulatory Visit: Payer: 59

## 2019-01-11 ENCOUNTER — Encounter: Payer: Self-pay | Admitting: Adult Health

## 2019-01-11 ENCOUNTER — Other Ambulatory Visit: Payer: Self-pay

## 2019-01-11 DIAGNOSIS — Z0000 Encounter for general adult medical examination without abnormal findings: Secondary | ICD-10-CM

## 2019-01-12 ENCOUNTER — Encounter: Payer: Self-pay | Admitting: Adult Health

## 2019-01-12 LAB — LIPID PANEL
Chol/HDL Ratio: 2.5 ratio (ref 0.0–4.4)
Cholesterol, Total: 134 mg/dL (ref 100–199)
HDL: 53 mg/dL (ref >39)
LDL Chol Calc (NIH): 69 mg/dL (ref 0–99)
Triglycerides: 57 mg/dL (ref 0–149)
VLDL Cholesterol Cal: 12 mg/dL (ref 5–40)

## 2019-01-12 LAB — CBC WITH DIFFERENTIAL/PLATELET
Basophils Absolute: 0.1 10*3/uL (ref 0.0–0.2)
Basos: 1 %
EOS (ABSOLUTE): 0.1 10*3/uL (ref 0.0–0.4)
Eos: 2 %
Hematocrit: 41 % (ref 34.0–46.6)
Hemoglobin: 14.4 g/dL (ref 11.1–15.9)
Immature Grans (Abs): 0 10*3/uL (ref 0.0–0.1)
Immature Granulocytes: 0 %
Lymphocytes Absolute: 2.2 10*3/uL (ref 0.7–3.1)
Lymphs: 32 %
MCH: 31.6 pg (ref 26.6–33.0)
MCHC: 35.1 g/dL (ref 31.5–35.7)
MCV: 90 fL (ref 79–97)
Monocytes Absolute: 0.6 10*3/uL (ref 0.1–0.9)
Monocytes: 9 %
Neutrophils Absolute: 3.8 10*3/uL (ref 1.4–7.0)
Neutrophils: 56 %
Platelets: 240 10*3/uL (ref 150–450)
RBC: 4.56 x10E6/uL (ref 3.77–5.28)
RDW: 12 % (ref 11.7–15.4)
WBC: 6.8 10*3/uL (ref 3.4–10.8)

## 2019-01-12 LAB — COMPREHENSIVE METABOLIC PANEL
ALT: 14 IU/L (ref 0–32)
AST: 13 IU/L (ref 0–40)
Albumin/Globulin Ratio: 1.3 (ref 1.2–2.2)
Albumin: 3.9 g/dL (ref 3.8–4.8)
Alkaline Phosphatase: 90 IU/L (ref 39–117)
BUN/Creatinine Ratio: 12 (ref 9–23)
BUN: 8 mg/dL (ref 6–20)
Bilirubin Total: 0.5 mg/dL (ref 0.0–1.2)
CO2: 26 mmol/L (ref 20–29)
Calcium: 8.9 mg/dL (ref 8.7–10.2)
Chloride: 103 mmol/L (ref 96–106)
Creatinine, Ser: 0.66 mg/dL (ref 0.57–1.00)
GFR calc Af Amer: 136 mL/min/{1.73_m2} (ref >59)
GFR calc non Af Amer: 118 mL/min/{1.73_m2} (ref >59)
Globulin, Total: 3.1 g/dL (ref 1.5–4.5)
Glucose: 86 mg/dL (ref 65–99)
Potassium: 4.1 mmol/L (ref 3.5–5.2)
Sodium: 139 mmol/L (ref 134–144)
Total Protein: 7 g/dL (ref 6.0–8.5)

## 2019-01-12 LAB — TSH: TSH: 1.48 u[IU]/mL (ref 0.450–4.500)

## 2019-01-12 LAB — HEMOGLOBIN A1C
Est. average glucose Bld gHb Est-mCnc: 94 mg/dL
Hgb A1c MFr Bld: 4.9 % (ref 4.8–5.6)

## 2019-01-14 ENCOUNTER — Encounter: Payer: Self-pay | Admitting: Adult Health

## 2019-02-19 ENCOUNTER — Telehealth: Payer: Self-pay | Admitting: Adult Health

## 2019-02-19 NOTE — Telephone Encounter (Signed)
Noted.  T. Fern Canova, CMA 

## 2019-02-19 NOTE — Telephone Encounter (Signed)
Penny Cooper called frm [facility] stating (Appt cancelled because pt's health insurance/[employer] did not approve Study.  ---Forwarding message to medical asst as FYI.  -glh

## 2019-02-27 ENCOUNTER — Encounter: Payer: Self-pay | Admitting: Obstetrics and Gynecology

## 2019-02-27 ENCOUNTER — Other Ambulatory Visit: Payer: Self-pay

## 2019-02-27 ENCOUNTER — Ambulatory Visit (INDEPENDENT_AMBULATORY_CARE_PROVIDER_SITE_OTHER): Payer: 59 | Admitting: Obstetrics and Gynecology

## 2019-02-27 ENCOUNTER — Other Ambulatory Visit (HOSPITAL_COMMUNITY)
Admission: RE | Admit: 2019-02-27 | Discharge: 2019-02-27 | Disposition: A | Discharge status: Home / Self Care | Payer: 59 | Source: Ambulatory Visit | Attending: Obstetrics and Gynecology | Admitting: Obstetrics and Gynecology

## 2019-02-27 VITALS — BP 122/68 | HR 76 | Temp 98.2°F | Ht 62.0 in | Wt 183.0 lb

## 2019-02-27 DIAGNOSIS — Z01419 Encounter for gynecological examination (general) (routine) without abnormal findings: Secondary | ICD-10-CM | POA: Diagnosis not present

## 2019-02-27 DIAGNOSIS — Z124 Encounter for screening for malignant neoplasm of cervix: Secondary | ICD-10-CM | POA: Insufficient documentation

## 2019-02-27 DIAGNOSIS — Z3009 Encounter for other general counseling and advice on contraception: Secondary | ICD-10-CM | POA: Diagnosis not present

## 2019-02-27 MED ORDER — LO LOESTRIN FE 1 MG-10 MCG / 10 MCG PO TABS: 1.0000 | ORAL_TABLET | Freq: Every day | ORAL | 0 refills | Status: DC

## 2019-02-27 NOTE — Patient Instructions (Signed)
EXERCISE AND DIET:  We recommended that you start or continue a regular exercise program for good health. Regular exercise means any activity that makes your heart beat faster and makes you sweat.  We recommend exercising at least 30 minutes per day at least 3 days a week, preferably 4 or 5.  We also recommend a diet low in fat and sugar.  Inactivity, poor dietary choices and obesity can cause diabetes, heart attack, stroke, and kidney damage, among others.    ALCOHOL AND SMOKING:  Women should limit their alcohol intake to no more than 7 drinks/beers/glasses of wine (combined, not each!) per week. Moderation of alcohol intake to this level decreases your risk of breast cancer and liver damage. And of course, no recreational drugs are part of a healthy lifestyle.  And absolutely no smoking or even second hand smoke. Most people know smoking can cause heart and lung diseases, but did you know it also contributes to weakening of your bones? Aging of your skin?  Yellowing of your teeth and nails?  CALCIUM AND VITAMIN D:  Adequate intake of calcium and Vitamin D are recommended.  The recommendations for exact amounts of these supplements seem to change often, but generally speaking 1,000 mg of calcium (between diet and supplement) and 800 units of Vitamin D per day seems prudent. Certain women may benefit from higher intake of Vitamin D.  If you are among these women, your doctor will have told you during your visit.    PAP SMEARS:  Pap smears, to check for cervical cancer or precancers,  have traditionally been done yearly, although recent scientific advances have shown that most women can have pap smears less often.  However, every woman still should have a physical exam from her gynecologist every year. It will include a breast check, inspection of the vulva and vagina to check for abnormal growths or skin changes, a visual exam of the cervix, and then an exam to evaluate the size and shape of the uterus and  ovaries.  And after 32 years of age, a rectal exam is indicated to check for rectal cancers. We will also provide age appropriate advice regarding health maintenance, like when you should have certain vaccines, screening for sexually transmitted diseases, bone density testing, colonoscopy, mammograms, etc.   MAMMOGRAMS:  All women over 40 years old should have a yearly mammogram. Many facilities now offer a "3D" mammogram, which may cost around $50 extra out of pocket. If possible,  we recommend you accept the option to have the 3D mammogram performed.  It both reduces the number of women who will be called back for extra views which then turn out to be normal, and it is better than the routine mammogram at detecting truly abnormal areas.    COLON CANCER SCREENING: Now recommend starting at age 45. At this time colonoscopy is not covered for routine screening until 50. There are take home tests that can be done between 45-49.   COLONOSCOPY:  Colonoscopy to screen for colon cancer is recommended for all women at age 50.  We know, you hate the idea of the prep.  We agree, BUT, having colon cancer and not knowing it is worse!!  Colon cancer so often starts as a polyp that can be seen and removed at colonscopy, which can quite literally save your life!  And if your first colonoscopy is normal and you have no family history of colon cancer, most women don't have to have it again for   10 years.  Once every ten years, you can do something that may end up saving your life, right?  We will be happy to help you get it scheduled when you are ready.  Be sure to check your insurance coverage so you understand how much it will cost.  It may be covered as a preventative service at no cost, but you should check your particular policy.      Breast Self-Awareness Breast self-awareness means being familiar with how your breasts look and feel. It involves checking your breasts regularly and reporting any changes to your  health care provider. Practicing breast self-awareness is important. A change in your breasts can be a sign of a serious medical problem. Being familiar with how your breasts look and feel allows you to find any problems early, when treatment is more likely to be successful. All women should practice breast self-awareness, including women who have had breast implants. How to do a breast self-exam One way to learn what is normal for your breasts and whether your breasts are changing is to do a breast self-exam. To do a breast self-exam: Look for Changes  1. Remove all the clothing above your waist. 2. Stand in front of a mirror in a room with good lighting. 3. Put your hands on your hips. 4. Push your hands firmly downward. 5. Compare your breasts in the mirror. Look for differences between them (asymmetry), such as: ? Differences in shape. ? Differences in size. ? Puckers, dips, and bumps in one breast and not the other. 6. Look at each breast for changes in your skin, such as: ? Redness. ? Scaly areas. 7. Look for changes in your nipples, such as: ? Discharge. ? Bleeding. ? Dimpling. ? Redness. ? A change in position. Feel for Changes Carefully feel your breasts for lumps and changes. It is best to do this while lying on your back on the floor and again while sitting or standing in the shower or tub with soapy water on your skin. Feel each breast in the following way:  Place the arm on the side of the breast you are examining above your head.  Feel your breast with the other hand.  Start in the nipple area and make  inch (2 cm) overlapping circles to feel your breast. Use the pads of your three middle fingers to do this. Apply light pressure, then medium pressure, then firm pressure. The light pressure will allow you to feel the tissue closest to the skin. The medium pressure will allow you to feel the tissue that is a little deeper. The firm pressure will allow you to feel the tissue  close to the ribs.  Continue the overlapping circles, moving downward over the breast until you feel your ribs below your breast.  Move one finger-width toward the center of the body. Continue to use the  inch (2 cm) overlapping circles to feel your breast as you move slowly up toward your collarbone.  Continue the up and down exam using all three pressures until you reach your armpit.  Write Down What You Find  Write down what is normal for each breast and any changes that you find. Keep a written record with breast changes or normal findings for each breast. By writing this information down, you do not need to depend only on memory for size, tenderness, or location. Write down where you are in your menstrual cycle, if you are still menstruating. If you are having trouble noticing differences   in your breasts, do not get discouraged. With time you will become more familiar with the variations in your breasts and more comfortable with the exam. How often should I examine my breasts? Examine your breasts every month. If you are breastfeeding, the best time to examine your breasts is after a feeding or after using a breast pump. If you menstruate, the best time to examine your breasts is 5-7 days after your period is over. During your period, your breasts are lumpier, and it may be more difficult to notice changes. When should I see my health care provider? See your health care provider if you notice:  A change in shape or size of your breasts or nipples.  A change in the skin of your breast or nipples, such as a reddened or scaly area.  Unusual discharge from your nipples.  A lump or thick area that was not there before.  Pain in your breasts.  Anything that concerns you.  Oral Contraception Information Oral contraceptive pills (OCPs) are medicines taken to prevent pregnancy. OCPs are taken by mouth, and they work by:  Preventing the ovaries from releasing eggs.  Thickening mucus in  the lower part of the uterus (cervix), which prevents sperm from entering the uterus.  Thinning the lining of the uterus (endometrium), which prevents a fertilized egg from attaching to the endometrium. OCPs are highly effective when taken exactly as prescribed. However, OCPs do not prevent STIs (sexually transmitted infections). Safe sex practices, such as using condoms while on an OCP, can help prevent STIs. Before starting OCPs Before you start taking OCPs, you may have a physical exam, blood test, and Pap test. However, you are not required to have a pelvic exam in order to be prescribed OCPs. Your health care provider will make sure you are a good candidate for oral contraception. OCPs are not a good option for certain women, including women who smoke and are older than 35 years, and women with a medical history of high blood pressure, deep vein thrombosis, pulmonary embolism, stroke, cardiovascular disease, or peripheral vascular disease. Discuss with your health care provider the possible side effects of the OCP you may be prescribed. When you start an OCP, be aware that it can take 2-3 months for your body to adjust to changes in hormone levels. Follow instructions from your health care provider about how to start taking your first cycle of OCPs. Depending on when you start the pill, you may need to use a backup form of birth control, such as condoms, during the first week. Make sure you know what steps to take if you ever forget to take the pill. Types of oral contraception  The most common types of birth control pills contain the hormones estrogen and progestin (synthetic progesterone) or progestin only. The combination pill This type of pill contains estrogen and progestin hormones. Combination pills often come in packs of 21, 28, or 91 pills. For each pack, the last 7 pills may not contain hormones, which means you may stop taking the pills for 7 days. Menstrual bleeding occurs during the  week that you do not take the pills or that you take the pills with no hormones in them. The minipill This type of pill contains the progestin hormone only. It comes in packs of 28 pills. All 28 pills contain the hormone. You take the pill every day. It is very important to take the pill at the same time each day. Advantages of oral contraceptive pills    Provides reliable and continuous contraception if taken as instructed.  May treat or decrease symptoms of: ? Menstrual period cramps. ? Irregular menstrual cycle or bleeding. ? Heavy menstrual flow. ? Abnormal uterine bleeding. ? Acne, depending on the type of pill. ? Polycystic ovarian syndrome. ? Endometriosis. ? Iron deficiency anemia. ? Premenstrual symptoms, including premenstrual dysphoric disorder.  May reduce the risk of endometrial and ovarian cancer.  Can be used as emergency contraception.  Prevents mislocated (ectopic) pregnancies and infections of the fallopian tubes. Things that can make oral contraceptive pills less effective OCPs can be less effective if:  You forget to take the pill at the same time every day. This is especially important when taking the minipill.  You have a stomach or intestinal disease that reduces your body's ability to absorb the pill.  You take OCPs with other medicines that make OCPs less effective, such as antibiotics, certain HIV medicines, and some seizure medicines.  You take expired OCPs.  You forget to restart the pill on day 7, if using the packs of 21 pills. Risks associated with oral contraceptive pills Oral contraceptive pills can sometimes cause side effects, such as:  Headache.  Depression.  Trouble sleeping.  Nausea and vomiting.  Breast tenderness.  Irregular bleeding or spotting during the first several months.  Bloating or fluid retention.  Increase in blood pressure. Combination pills are also associated with a small increase in the risk of:  Blood  clots.  Heart attack.  Stroke. Summary  Oral contraceptive pills are medicines taken by mouth to prevent pregnancy. They are highly effective when taken exactly as prescribed.  The most common types of birth control pills contain the hormones estrogen and progestin (synthetic progesterone) or progestin only.  Before you start taking the pill, you may have a physical exam, blood test, and Pap test. Your health care provider will make sure you are a good candidate for oral contraception.  The combination pill may come in a 21-day pack, a 28-day pack, or a 91-day pack. The minipill contains the progesterone hormone only and comes in packs of 28 pills.  Oral contraceptive pills can sometimes cause side effects, such as headache, nausea, breast tenderness, or irregular bleeding. This information is not intended to replace advice given to you by your health care provider. Make sure you discuss any questions you have with your health care provider. Document Revised: 01/20/2017 Document Reviewed: 05/03/2016 Elsevier Patient Education  2020 [employer]  

## 2019-02-27 NOTE — Progress Notes (Signed)
34 y.o. [id number]. Married White or Caucasian Not Hispanic or Latino female here for annual exam.  Patient states that she moved here from [place] 2 years ago and established with PCP. PCP recommended seeing [place] since its been 3 years since she has had a pap. She would also like to talk about birth control options   Period Cycle (Days): 28 Period Duration (Days): 3-4 Period Pattern: Regular Menstrual Flow: Moderate Menstrual Control: Maxi pad Menstrual Control Change Freq (Hours): changes every 3-4 hours. Dysmenorrhea: (!) Mild Dysmenorrhea Symptoms: Nausea, Cramping, Other (Comment)(lower back achey)  The cramps are in her lower back the week prior to her cycle, mild. No cramps the week of her cycle.   Patient's last menstrual period was 02/27/2019 (exact date).          Sexually active: Yes.    The current method of family planning is condoms sometimes.    Exercising: No  The patient does not participate in regular exercise at present. Smoker:  no  Health Maintenance: Pap:  09/07/16 normal  History of abnormal Pap:  no TDaP: 01/07/2014  Gardasil: no   reports that she has never smoked. She has never used smokeless tobacco. She reports current alcohol use. She reports that she does not use drugs. Rare ETOH. 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 Daughter almost 78, son is 15, daughter is 5. Husband works in Engineer, technical sales.  Past Medical History:  Diagnosis Date  . Optic neuritis     Past Surgical History:  Procedure Laterality Date  . TONSILLECTOMY AND ADENOIDECTOMY    . WISDOM TOOTH EXTRACTION      No current outpatient medications on file.   No current facility-administered medications for this visit.    Family History  Problem Relation Age of Onset  . Diabetes Maternal Grandmother   . Hypertension Maternal Grandmother   . Cancer Maternal Grandfather   . Heart attack Maternal Grandfather   . Hypertension Paternal Grandfather   . Stroke Paternal  Grandfather   Mom died in 06/30/2008 of an overdose.   Review of Systems  All other systems reviewed and are negative.   Exam:   BP 122/68   Pulse 76   Temp 98.2 F (36.8 C)   Ht 5\' 2"  (1.575 m)   Wt 183 lb (83 kg)   LMP 02/27/2019 (Exact Date)   SpO2 96%   BMI 33.47 kg/m   Weight change: @WEIGHTCHANGE @ Height:   Height: 5\' 2"  (157.5 cm)  Ht Readings from Last 3 Encounters:  02/27/19 5\' 2"  (1.575 m)  01/02/19 5' 2.5" (1.588 m)    General appearance: alert, cooperative and appears stated age Head: Normocephalic, without obvious abnormality, atraumatic Neck: no adenopathy, supple, symmetrical, trachea midline and thyroid normal to inspection and palpation Lungs: clear to auscultation bilaterally Cardiovascular: regular rate and rhythm Breasts: normal appearance, no masses or tenderness Abdomen: soft, non-tender; non distended,  no masses,  no organomegaly Extremities: extremities normal, atraumatic, no cyanosis or edema Skin: Skin color, texture, turgor normal. No rashes or lesions Lymph nodes: Cervical, supraclavicular, and axillary nodes normal. No abnormal inguinal nodes palpated Neurologic: Grossly normal   Pelvic: External genitalia:  no lesions              Urethra:  normal appearing urethra with no masses, tenderness or lesions              Bartholins and Skenes: normal  Vagina: normal appearing vagina with normal color and discharge, no lesions              Cervix: no lesions               Bimanual Exam:  Uterus:  normal size, contour, position, consistency, mobility, non-tender and anteverted              Adnexa: no mass, fullness, tenderness               Rectovaginal: Confirms               Anus:  normal sphincter tone, no lesions  [name] chaperoned for the exam.  A:  Well Woman with normal exam  Contraception counseling, reviewed all the options  P:   She would like to start on OCP's, no contraindications, risks reviewed  Start Lo  Loestrin  F/u in 3 months  Labs UTD with her primary  Discussed breast self exam  Discussed calcium and vit D intake  Pap with hpv

## 2019-02-28 ENCOUNTER — Encounter: Payer: Self-pay | Admitting: Obstetrics and Gynecology

## 2019-02-28 ENCOUNTER — Encounter: Payer: Self-pay | Admitting: Adult Health

## 2019-02-28 ENCOUNTER — Telehealth: Payer: Self-pay | Admitting: Obstetrics and Gynecology

## 2019-02-28 ENCOUNTER — Other Ambulatory Visit: Payer: Self-pay

## 2019-02-28 DIAGNOSIS — Z87898 Personal history of other specified conditions: Secondary | ICD-10-CM

## 2019-02-28 MED ORDER — NORETHIN ACE-ETH ESTRAD-FE 1-20 MG-MCG PO TABS: 1.0000 | ORAL_TABLET | Freq: Every day | ORAL | 0 refills | Status: DC

## 2019-02-28 NOTE — Telephone Encounter (Signed)
Spoke with pt. Pt states went to get OCPs picked up at pharmacy after OV yesterday and was told that insurance now will not cover 100% of Lo Loestrin Fe. Looked on [facility] and with coupon for 3 month supply is $437.00. Pt was told it was over $500 at pharmacy. Pt wanting to know if there is another similar OCPs that would possibly be covered? Will review with Dr Oscar La and give call back to pt. Pt agreeable.   Will route to Dr Oscar La for recommendations.

## 2019-02-28 NOTE — Telephone Encounter (Signed)
Spoke to pt. Pt notified of new Rx sent to pharmacy. Pt agreeable. Will return call to office if has trouble getting new Rx filled with insurance.   Will route to Dr Oscar La for final review and will close encounter.

## 2019-02-28 NOTE — Telephone Encounter (Signed)
I've sent in a 3 month script for loestin 1/20. It is the next dose up of Lo Loestrin, having of estrogen vs the 10 mcg that is in the Lo Loestrin. There is no generic pill with only 10 mcg of estrogen.

## 2019-02-28 NOTE — Telephone Encounter (Signed)
Patient sent the following message through MyChart. Routing to triage to assist patient with request.  Marvelyn, [facility]  Phone Number: [phone]  Good morning!  [facility] control and they wanted almost $500 for the month supply. I called my insurance company and apparently this medication is no longer covered 100% under family planning like it had been in the past.  The rep was unable to provide me with a list of what was 100% covered, so I was curious if you knew of any other low hormonal pills or if this was the only one?  I know I was placed on this one because of the optic neuritis and low estrogen levels -- something to do with visual changes/clots/side effects that would be difficult with the optic neuritis -- so I am unsure if there are others.  The insurance rep said I can appeal this and try to get it covered if it is medically necessary, but I am not sure if it is or not.   Thank you for any insight you can offer on this :)  -[place]

## 2019-03-01 LAB — CYTOLOGY - PAP
Comment: NEGATIVE
Diagnosis: NEGATIVE
High risk HPV: NEGATIVE

## 2019-03-20 ENCOUNTER — Ambulatory Visit (INDEPENDENT_AMBULATORY_CARE_PROVIDER_SITE_OTHER): Payer: 59 | Admitting: Neurology

## 2019-03-20 ENCOUNTER — Encounter: Payer: Self-pay | Admitting: Neurology

## 2019-03-20 ENCOUNTER — Other Ambulatory Visit: Payer: Self-pay

## 2019-03-20 VITALS — BP 120/76 | HR 80 | Temp 97.7°F | Ht 62.0 in | Wt 183.0 lb

## 2019-03-20 DIAGNOSIS — Z6832 Body mass index (BMI) 32.0-32.9, adult: Secondary | ICD-10-CM

## 2019-03-20 DIAGNOSIS — F5112 Insufficient sleep syndrome: Secondary | ICD-10-CM

## 2019-03-20 DIAGNOSIS — R0683 Snoring: Secondary | ICD-10-CM | POA: Diagnosis not present

## 2019-03-20 DIAGNOSIS — G478 Other sleep disorders: Secondary | ICD-10-CM | POA: Insufficient documentation

## 2019-03-20 DIAGNOSIS — Z87898 Personal history of other specified conditions: Secondary | ICD-10-CM

## 2019-03-20 DIAGNOSIS — Z0000 Encounter for general adult medical examination without abnormal findings: Secondary | ICD-10-CM

## 2019-03-20 DIAGNOSIS — Z72820 Sleep deprivation: Secondary | ICD-10-CM | POA: Diagnosis not present

## 2019-03-20 DIAGNOSIS — Z8669 Personal history of other diseases of the nervous system and sense organs: Secondary | ICD-10-CM

## 2019-03-20 NOTE — Patient Instructions (Signed)

## 2019-03-20 NOTE — Progress Notes (Signed)
SLEEP MEDICINE CLINIC    Provider:  Melvyn Novas, MD  Primary Care Physician:  Julaine Fusi, NP [address] [place] [address]     Referring Provider: Julaine Fusi, Np [address] [place],  [place] [address]          Chief Complaint according to patient   Patient presents with:    . New Patient (Initial Visit)      RN : rm 10. pt husband has told her she snores at night. she is unaware of apnea events and has never had a PSG/ HST and  states she may get 4- 6 hrs of sleep each night but wakes up to void and goes back to sleep.  Does c/o of fatigue upon waking up and throughout the day      HISTORY OF PRESENT ILLNESS:  Penny Cooper is a 34 year old Caucasian female patient seen on 03/20/2019 . Chief concern according to patient :  " Mrs. Cavan reports that she has been a light sleeper, wakes up if her husband snores or even moves at times, overnight her husband will ask her to go to the couch because she has snored.  Over the last year she has slept more on the couch than in her bed.  That her sleep has been light and that she feels somewhat deprived of restful and qualitative sleep has been present for years. This could have started with her pregnancy and births of 3 children, now 4( girl)  and 59 ( boy) and 32 years old girl.    I have the pleasure of seeing Penny Cooper today, a right -handed  Caucasian female with insomnia.  She has a  has a past medical history of Optic neuritis.     Sleep relevant medical history:  She had a Tonsillectomy/ adenoidectomy at age 68-2 , all 4 wisdom teeth removed. She has a left nasion polyp.  She considers herself a mouth breather.    Family medical /sleep history: no other family member on CPAP with OSA, insomnia, sleep walkers. Father is a snorer.   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 Family status is married with 3 children.  The  patient currently works 3 12 hour shifts and commmutes 1 hour each way.  Pets are present. Tobacco use; never.  ETOH use: seldomly  , Caffeine intake in form of Coffee( cold coffee in AM ) Soda( quit  Just New Years-- from 4 a day, now 1 a day)  Tea ( none  ) or energy drinks. Regular exercise - joined [employer] on Saturday.    Sleep habits are as follows:  The patient's dinner time is between 9.30 after return from work, all other nights 5-6 PM.  The kids go to bed at 8.30,  patient goes to bed at 11 PM and continues to sleep for 3-4  hours, wakes for one bathroom break, the first time at 1-3 AM.  s The preferred sleep position is side or prone, with the support of 1 pillow.  Dreams are reportedly rare.   4.50 AM is the usual rise time. The patient wakes up  at 4.30 with an alarm.  She sets 3 alarms   She reports many mornings not feeling refreshed or restored in AM, with symptoms such as dry mouth, rarely morning headaches, often residual fatigue. Naps are taken  infrequently.  Review of Systems: Out of a complete 14 system review, the patient complains of only the following symptoms, and all other reviewed systems are negative.:  Fatigue, sleepiness , snoring, fragmented sleep, Insomnia - sleep deprived.    How likely are you to doze in the following situations: 0 = not likely, 1 = slight chance, 2 = moderate chance, 3 = high chance   Sitting and Reading? Watching Television? Sitting inactive in a public place (theater or meeting)? As a passenger in a car for an hour without a break? Lying down in the afternoon when circumstances permit? Sitting and talking to someone? Sitting quietly after lunch without alcohol? In a car, while stopped for a few minutes in traffic?   Total = 9/ 24 points   FSS endorsed at 30/ 63 points.   Social History   Socioeconomic History  . Marital status: Married    Spouse name: Not on file  . Number of children: Not on file  . Years of education: Not on file   . Highest education level: Not on file  Occupational History  . Not on file  Tobacco Use  . Smoking status: Never Smoker  . Smokeless tobacco: Never Used  Substance and Sexual Activity  . Alcohol use: Yes    Comment: occassionally 1-2 a yr  . Drug use: Never  . Sexual activity: Yes    Birth control/protection: None  Other Topics Concern  . Not on file  Social History Narrative  . Not on file   Social Determinants of Health   Financial Resource Strain:   . Difficulty of Paying Living Expenses: Not on file  Food Insecurity:   . Worried About Charity fundraiser in the Last Year: Not on file  . Ran Out of Food in the Last Year: Not on file  Transportation Needs:   . Lack of Transportation (Medical): Not on file  . Lack of Transportation (Non-Medical): Not on file  Physical Activity:   . Days of Exercise per Week: Not on file  . Minutes of Exercise per Session: Not on file  Stress:   . Feeling of Stress : Not on file  Social Connections:   . Frequency of Communication with Friends and Family: Not on file  . Frequency of Social Gatherings with Friends and Family: Not on file  . Attends Religious Services: Not on file  . Active Member of Clubs or Organizations: Not on file  . Attends Archivist Meetings: Not on file  . Marital Status: Not on file    Family History  Problem Relation Age of Onset  . Diabetes Maternal Grandmother   . Hypertension Maternal Grandmother   . Cancer Maternal Grandfather   . Heart attack Maternal Grandfather   . Hypertension Paternal Grandfather   . Stroke Paternal Grandfather     Past Medical History:  Diagnosis Date  . Optic neuritis     Past Surgical History:  Procedure Laterality Date  . TONSILLECTOMY AND ADENOIDECTOMY    . WISDOM TOOTH EXTRACTION       Current Outpatient Medications on File Prior to Visit  Medication Sig Dispense Refill  . ibuprofen (ADVIL) 200 MG tablet Take 200 mg by mouth every 6 (six) hours as  needed.    . norethindrone-ethinyl estradiol (LOESTRIN FE) 1-20 MG-MCG tablet Take 1 tablet by mouth daily. (Patient not taking: Reported on 03/20/2019) 3 Package 0   No current facility-administered medications on file prior to visit.  Allergies  Allergen Reactions  . Mango Flavor Hives    Physical exam:  Today's Vitals   03/20/19 1008  BP: 120/76  Pulse: 80  Temp: 97.7 F (36.5 C)  Weight: 183 lb (83 kg)  Height: 5\' 2"  (1.575 m)   Body mass index is 33.47 kg/m.   Wt Readings from Last 3 Encounters:  03/20/19 183 lb (83 kg)  02/27/19 183 lb (83 kg)  01/02/19 182 lb (82.6 kg)     Ht Readings from Last 3 Encounters:  03/20/19 5\' 2"  (1.575 m)  02/27/19 5\' 2"  (1.575 m)  01/02/19 5' 2.5" (1.588 m)      General: The patient is awake, alert and appears not in acute distress. The patient is well groomed. Head: Normocephalic, atraumatic. Neck is supple. Mallampati 2-3  ,  neck circumference:14  inches . Nasal airflow patent.  Retrognathia is mild, she wore braces. .  Dental status: intact. Cardiovascular:  Regular rate and cardiac rhythm by pulse,  without distended neck veins. Respiratory: Lungs are clear to auscultation.  Skin:  Without evidence of ankle edema, or rash. Trunk: The patient's posture is erect.   Neurologic exam : The patient is awake and alert, oriented to place and time.   Memory subjective described as intact.  Attention span & concentration ability appears normal.  Speech is fluent,  without  dysarthria, dysphonia or aphasia.  Mood and affect are appropriate.   Cranial nerves: no loss of smell or taste reported -  Pupils are equal and briskly reactive to light. Funduscopic exam deferred.   Extraocular movements in vertical and horizontal planes were intact and without nystagmus. No Diplopia. Visual fields by finger perimetry are intact. Hearing was intact to soft voice and finger rubbing.   Facial sensation intact to fine touch. Facial motor  strength is symmetric and tongue and uvula move midline.  Neck ROM : rotation, tilt and flexion extension were normal for age and shoulder shrug was symmetrical.    Motor exam:  Symmetric bulk, tone and ROM.   Normal tone without cog wheeling, symmetric grip strength . Sensory:  Fine touch, pinprick and vibration were tested  and  normal.  Proprioception tested in the upper extremities was normal. Coordination: Rapid alternating movements in the fingers/hands were of normal speed.  The Finger-to-nose maneuver was intact without evidence of ataxia, dysmetria or tremor.   Gait and station: Patient could rise unassisted from a seated position, walked without assistive device.  Stance is of normal width/ base and the patient turned with 3 steps ( RN observation) .  Toe and heel walk were deferred.  Deep tendon reflexes: in the  upper and lower extremities are symmetric and intact.  Babinski response was deferred.       After spending a total time of 40 minutes face to face and additional time for physical and neurologic examination, review of laboratory studies,  personal review of imaging studies, reports and results of other testing and review of referral information / records as far as provided in visit, I have established the following assessments:  1)  Snoring , light sleeper since she became  a mother. +Screening for sleep apnea. 2)  Light sleeper- melatonin - 5 mg or less at bedtime.  3) weight 4)  For now-long commute- detracts from sleep duration.     My Plan is to proceed with:  1) HST  I would like to thank Danford, 04/27/19, NP and , Np [address] [place],  [place] [address] for allowing me to meet with and to take care of this pleasant patient.   In short, Penny Cooper is presenting with sleep deprivation, fragmentation, insomnia.  I plan to follow up either personally or through our NP within 2-3  month.     Electronically signed by: Melvyn Novas, MD 03/20/2019 10:19 AM  [facility] Board certified by The [employer] of Sleep Medicine and Diplomate of the [employer] of Sleep Medicine. Board certified In Neurology through the ABPN, Fellow of the [employer] of Neurology. Medical Director of [facility].

## 2019-04-29 ENCOUNTER — Ambulatory Visit (INDEPENDENT_AMBULATORY_CARE_PROVIDER_SITE_OTHER): Payer: 59 | Admitting: Neurology

## 2019-04-29 DIAGNOSIS — Z8669 Personal history of other diseases of the nervous system and sense organs: Secondary | ICD-10-CM

## 2019-04-29 DIAGNOSIS — G4710 Hypersomnia, unspecified: Secondary | ICD-10-CM | POA: Diagnosis not present

## 2019-04-29 DIAGNOSIS — Z87898 Personal history of other specified conditions: Secondary | ICD-10-CM

## 2019-04-29 DIAGNOSIS — Z0000 Encounter for general adult medical examination without abnormal findings: Secondary | ICD-10-CM

## 2019-04-29 DIAGNOSIS — Z72820 Sleep deprivation: Secondary | ICD-10-CM

## 2019-04-29 DIAGNOSIS — R0683 Snoring: Secondary | ICD-10-CM

## 2019-04-29 DIAGNOSIS — F5112 Insufficient sleep syndrome: Secondary | ICD-10-CM

## 2019-04-29 DIAGNOSIS — Z6832 Body mass index (BMI) 32.0-32.9, adult: Secondary | ICD-10-CM

## 2019-05-08 ENCOUNTER — Encounter: Payer: Self-pay | Admitting: Neurology

## 2019-05-08 NOTE — Procedures (Signed)
Patient Information     First Name: Penny Last Name: Cooper ID: [id number]  Birth Date: 03-17-1985 Age: 34 Gender: Female  Referring Provider: Julaine Fusi, NP BMI: 33.7 (W=183 lb, H=5' 2'')  Neck Circ.:  14 '' Epworth:  9/24   Sleep Study Information    Study Date: Apr 29, 2019 S/H/A Version: [id number] / [id number] / 77  History:    [name] is a 34 year- old Caucasian female patient, seen on 03-20-2019.  " Mrs. Schoenfelder reports that she has been a light sleeper, wakes up if her husband snores or even moves at times, and overnight her husband will ask her to go to the couch because she has snored.  Over the last year she has slept more on the couch than in her bed.  That her sleep has been light and that she feels somewhat deprived of restful and qualitative sleep has been present for years. This could have started with her pregnancy and births of 3 children, now 25 (girl) and 93 ( boy) and a 32 years old girl. She has a medical history of Optic neuritis.       Summary & Diagnosis:      This HST reveals a somewhat unusual constellation- the absence of sleep apnea and presence of snoring. The sleep architecture is also unusual, as there are many REM sleep cycles and a high REM sleep proportion noted. REM onset within 40 minutes, no hypoxemia and no tachy-bradycardia of significance noted.    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 Consider ENT visit. Weight loss and avoiding supine sleep.   Interpreting Physician: Melvyn Novas, MD              Sleep Summary    Oxygen Saturation Statistics     Start Study Time: End Study Time: Total Recording Time:  8:53:58 PM 6:57:59 AM 10 h, 4 min  Total Sleep Time % REM of Sleep Time:  8 h, 57 min 27.9    Mean: 96 Minimum: 92 Maximum: 99  Mean of Desaturations Nadirs (%):   92   Oxygen Desaturation. %: 4-9 10-20 >20 Total  Events Number Total  1 100.0  0 0.0  0 0.0  1 100.0  Oxygen Saturation: <90 <=88 <85 <80 <70  Duration (minutes): Sleep % 0.0 0.0 0.0 0.0 0.0 0.0 0.0 0.0 0.0 0.0     Respiratory Indices      Total Events REM NREM All Night  pRDI:  147  pAHI:  5 ODI:  1  pAHIc:  0  % CSR: 0.0 18.0 1.6 0.4 0.0 15.8 0.2 0.0 0.0 16.4 0.6 0.1 0.0       Pulse Rate Statistics during Sleep (BPM)      Mean:  73 Minimum: 49 Maximum: 117    Indices are calculated using technically valid sleep time of 8 h, 56 min. pRDI/pAHI are calculated using 02 desaturations ? 3%  Body Position Statistics  Position Supine Prone Right Left Non-Supine  Sleep (min) 117.0 57.0 244.5 119.0 420.5  Sleep % 21.8 10.6 45.5 22.1 78.2  pRDI 19.0 27.4 17.4 6.6 15.7  pAHI 0.0 1.1 0.5 1.0 0.7  ODI 0.5 0.0 0.0 0.0 0.0     Snoring Statistics Snoring Level (dB) >40 >50 >60 >70 >80 >Threshold (45)  Sleep (min) 194.3 18.6 2.9 0.6 0.0  53.5  Sleep % 36.1 3.5 0.5 0.1 0.0 10.0    Mean: 42 dB

## 2019-05-08 NOTE — Progress Notes (Signed)
Summary & Diagnosis:     This HST reveals a somewhat unusual constellation- the absence of  sleep apnea and presence of snoring. The sleep architecture is  also unusual, as there are many REM sleep cycles and a high REM  sleep proportion noted. REM onset within 40 minutes, no hypoxemia  and no tachy-bradycardia of significance noted.    Recommendations:    This would be consistent with UARS- Upper Airway Resistance  Syndrome and can be treated with a dental device, can also  improve with increasing nasal patency. Consider ENT visit.,weight  loss and avoiding supine sleep.  Follow up with NP  Interpreting Physician: Melvyn Novas, MD

## 2019-05-09 ENCOUNTER — Telehealth: Payer: Self-pay | Admitting: Neurology

## 2019-05-09 NOTE — Telephone Encounter (Signed)
Called patient to discuss sleep study results. No answer at this time. LVM for the patient to call back.  Advised I would respond to the patient's message on mychart.

## 2019-05-09 NOTE — Telephone Encounter (Signed)
-----   Message from Melvyn Novas, MD sent at 05/08/2019 12:54 PM EDT ----- Summary & Diagnosis:     This HST reveals a somewhat unusual constellation- the absence of  sleep apnea and presence of snoring. The sleep architecture is  also unusual, as there are many REM sleep cycles and a high REM  sleep proportion noted. REM onset within 40 minutes, no hypoxemia  and no tachy-bradycardia of significance noted.    Recommendations:    This would be consistent with UARS- Upper Airway Resistance  Syndrome and can be treated with a dental device, can also  improve with increasing nasal patency. Consider ENT visit.,weight  loss and avoiding supine sleep.  Follow up with NP  Interpreting Physician: Melvyn Novas, MD

## 2019-05-15 ENCOUNTER — Other Ambulatory Visit: Payer: Self-pay | Admitting: Obstetrics and Gynecology

## 2019-05-15 NOTE — Telephone Encounter (Signed)
Medication refill request: OCP Last AEX:  03-17-19 JJ  Three month follow-up: 06-26-19 Last MMG (if hormonal medication request): n/a Refill authorized: today, please advise.   Medication pended for #84, 0RF. Please refill if appropriate.

## 2019-05-21 DIAGNOSIS — J343 Hypertrophy of nasal turbinates: Secondary | ICD-10-CM | POA: Insufficient documentation

## 2019-05-21 DIAGNOSIS — J342 Deviated nasal septum: Secondary | ICD-10-CM | POA: Insufficient documentation

## 2019-05-29 ENCOUNTER — Ambulatory Visit: Payer: 59 | Admitting: Obstetrics and Gynecology

## 2019-06-26 ENCOUNTER — Ambulatory Visit: Payer: 59 | Admitting: Obstetrics and Gynecology

## 2019-07-05 ENCOUNTER — Encounter: Payer: Self-pay | Admitting: Adult Health

## 2019-08-25 ENCOUNTER — Other Ambulatory Visit: Payer: Self-pay | Admitting: Obstetrics and Gynecology

## 2019-08-28 ENCOUNTER — Telehealth: Payer: Self-pay

## 2019-08-28 NOTE — Telephone Encounter (Signed)
Patient called to cancel 3 month pill follow up due to schedule conflict. Patient did not wish to reschedule at this time.

## 2019-09-17 ENCOUNTER — Encounter: Payer: Self-pay | Admitting: Physician Assistant

## 2019-09-17 ENCOUNTER — Other Ambulatory Visit: Payer: Self-pay

## 2019-09-17 ENCOUNTER — Ambulatory Visit (INDEPENDENT_AMBULATORY_CARE_PROVIDER_SITE_OTHER): Payer: 59 | Admitting: Physician Assistant

## 2019-09-17 VITALS — BP 129/79 | HR 82 | Temp 98.1°F | Ht 62.0 in | Wt 191.5 lb

## 2019-09-17 DIAGNOSIS — Z8669 Personal history of other diseases of the nervous system and sense organs: Secondary | ICD-10-CM | POA: Diagnosis not present

## 2019-09-17 NOTE — Patient Instructions (Signed)

## 2019-09-17 NOTE — Progress Notes (Signed)
Established Patient Office Visit  Subjective:  Patient ID: Penny Cooper, female    DOB: 1985/03/11  Age: 34 y.o. MRN: [id number]  CC: No chief complaint on file.   HPI Penny Cooper presents for discussion of covid-19 vaccines. 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 Reports she started having vision problems shortly after getting influenza vaccine but no clear etiology was discovered.  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  She has concerns about COVID-19 vaccine due to possible unknown side effects, especially neurological.   Past Medical History:  Diagnosis Date  . Optic neuritis     Past Surgical History:  Procedure Laterality Date  . TONSILLECTOMY AND ADENOIDECTOMY    . WISDOM TOOTH EXTRACTION      Family History  Problem Relation Age of Onset  . Diabetes Maternal Grandmother   . Hypertension Maternal Grandmother   . Cancer Maternal Grandfather   . Heart attack Maternal Grandfather   . Hypertension Paternal Grandfather   . Stroke Paternal Grandfather     Social History   Socioeconomic History  . Marital status: Married    Spouse name: Not on file  . Number of children: Not on file  . Years of education: Not on file  . Highest education level: Not on file  Occupational History  . Not on file  Tobacco Use  . Smoking status: Never Smoker  . Smokeless tobacco: Never Used  Vaping Use  . Vaping Use: Never used  Substance and Sexual Activity  . Alcohol use: Yes    Comment: occassionally 1-2 a yr  . Drug use: Never  . Sexual activity: Yes    Birth control/protection: None  Other Topics Concern  . Not on file  Social History Narrative  . Not on file   Social Determinants of  Health   Financial Resource Strain:   . Difficulty of Paying Living Expenses:   Food Insecurity:   . Worried About Programme researcher, broadcasting/film/video in the Last Year:   . Barista in the Last Year:   Transportation Needs:   . Freight forwarder (Medical):   Marland Kitchen Lack of Transportation (Non-Medical):   Physical Activity:   . Days of Exercise per Week:   . Minutes of Exercise per Session:   Stress:   . Feeling of Stress :   Social Connections:   . Frequency of Communication with Friends and Family:   . Frequency of Social Gatherings with Friends and Family:   . Attends Religious Services:   . Active Member of Clubs or Organizations:   . Attends Banker Meetings:   Marland Kitchen Marital Status:   Intimate Partner Violence:   . Fear of Current or Ex-Partner:   . Emotionally Abused:   Marland Kitchen Physically Abused:   . Sexually Abused:     Outpatient Medications Prior to Visit  Medication Sig Dispense Refill  . fluticasone (FLONASE) 50 MCG/ACT nasal spray Place into the nose.    . ibuprofen (ADVIL) 200 MG tablet Take 200 mg by mouth every 6 (six) hours as needed.    Colleen Can FE 1/20 1-20 MG-MCG tablet TAKE 1 TABLET BY MOUTH EVERY DAY (Patient not taking: Reported on 09/17/2019) 84 tablet 0  No facility-administered medications prior to visit.    Allergies  Allergen Reactions  . Mango Flavor Hives    ROS Review of Systems Review of Systems:  A fourteen system review of systems was performed and found to be positive as per HPI.  Objective:    Physical Exam General:  Well Developed, well nourished, appropriate for stated age.  Neuro:  Alert and oriented,  extra-ocular muscles intact, no focal deficits  HEENT:  Normocephalic, atraumatic, neck supple, no carotid bruits appreciated  Skin:  no gross rash, warm, pink. Cardiac:  RRR, S1 S2 Respiratory:  ECTA B/L and A/P, Not using accessory muscles, speaking in full sentences- unlabored. Vascular:  Ext warm, no cyanosis apprec.; cap RF  less 2 sec. Psych:  No HI/SI, judgement and insight good, Euthymic mood. Full Affect.   BP (!) 129/79   Pulse 82   Temp 98.1 F (36.7 C) (Oral)   Ht 5\' 2"  (1.575 m)   Wt 191 lb 8 oz (86.9 kg)   LMP  (LMP Unknown)   SpO2 97%   BMI 35.03 kg/m  Wt Readings from Last 3 Encounters:  09/17/19 191 lb 8 oz (86.9 kg)  03/20/19 183 lb (83 kg)  02/27/19 183 lb (83 kg)     Health Maintenance Due  Topic Date Due  . Hepatitis C Screening  Never done  . COVID-19 Vaccine (1) Never done  . HIV Screening  Never done  . INFLUENZA VACCINE  09/22/2019    There are no preventive care reminders to display for this patient.  Lab Results  Component Value Date   TSH 1.480 01/11/2019   Lab Results  Component Value Date   WBC 6.8 01/11/2019   HGB 14.4 01/11/2019   HCT 41.0 01/11/2019   MCV 90 01/11/2019   PLT 240 01/11/2019   Lab Results  Component Value Date   NA 139 01/11/2019   K 4.1 01/11/2019   CO2 26 01/11/2019   GLUCOSE 86 01/11/2019   BUN 8 01/11/2019   CREATININE 0.66 01/11/2019   BILITOT 0.5 01/11/2019   ALKPHOS 90 01/11/2019   AST 13 01/11/2019   ALT 14 01/11/2019   PROT 7.0 01/11/2019   ALBUMIN 3.9 01/11/2019   CALCIUM 8.9 01/11/2019   Lab Results  Component Value Date   CHOL 134 01/11/2019   Lab Results  Component Value Date   HDL 53 01/11/2019   Lab Results  Component Value Date   LDLCALC 69 01/11/2019   Lab Results  Component Value Date   TRIG 57 01/11/2019   Lab Results  Component Value Date   CHOLHDL 2.5 01/11/2019   Lab Results  Component Value Date   HGBA1C 4.9 01/11/2019      Assessment & Plan:   Problem List Items Addressed This Visit      Other   H/O optic neuritis - Primary     H/o optic neuritis: -Extensively reviewed past medical records from 01/13/2019 and no exact etiology was found for optic neuritis and patient underwent multiple IV prednisolone infusions. -Discussed with patient risk versus benefit of COVID-19 vaccines, and  her concerns are reasonable and it is appropriate to hold off on COVID-19 vaccine at this time given her history of optic neuritis of unknown etiology. Patient did not completely regain vision of right eye and is concerned about risking complete vision loss, which is appropriate, and COVID-19 vaccine can be reconsidered once more data and studies are available regarding neurological side effects. -Advised to forward needed  information/form for medical exemption consideration. -Recommend to continue with safety measures including wearing a mask/PPE.    No orders of the defined types were placed in this encounter.   Follow-up: Return if symptoms worsen or fail to improve.    Mayer Masker, PA-C

## 2019-09-19 ENCOUNTER — Ambulatory Visit: Payer: 59 | Admitting: Obstetrics and Gynecology

## 2019-09-20 ENCOUNTER — Encounter: Payer: Self-pay | Admitting: Physician Assistant

## 2020-06-02 ENCOUNTER — Encounter: Payer: Self-pay | Admitting: Physician Assistant

## 2020-06-02 ENCOUNTER — Other Ambulatory Visit: Payer: Self-pay

## 2020-06-02 ENCOUNTER — Ambulatory Visit (INDEPENDENT_AMBULATORY_CARE_PROVIDER_SITE_OTHER): Payer: 59 | Admitting: Physician Assistant

## 2020-06-02 VITALS — BP 102/66 | HR 84 | Temp 97.6°F | Ht 62.0 in | Wt 182.3 lb

## 2020-06-02 DIAGNOSIS — Z0000 Encounter for general adult medical examination without abnormal findings: Secondary | ICD-10-CM

## 2020-06-02 DIAGNOSIS — Z1329 Encounter for screening for other suspected endocrine disorder: Secondary | ICD-10-CM

## 2020-06-02 DIAGNOSIS — Z1321 Encounter for screening for nutritional disorder: Secondary | ICD-10-CM

## 2020-06-02 DIAGNOSIS — Z131 Encounter for screening for diabetes mellitus: Secondary | ICD-10-CM | POA: Diagnosis not present

## 2020-06-02 DIAGNOSIS — Z13220 Encounter for screening for lipoid disorders: Secondary | ICD-10-CM

## 2020-06-02 DIAGNOSIS — R5383 Other fatigue: Secondary | ICD-10-CM

## 2020-06-02 NOTE — Patient Instructions (Signed)
Preventive Care 11-33 Years Old, Female Preventive care refers to lifestyle choices and visits with your health care provider that can promote health and wellness. This includes:  A yearly physical exam. This is also called an annual wellness visit.  Regular dental and eye exams.  Immunizations.  Screening for certain conditions.  Healthy lifestyle choices, such as: ? Eating a healthy diet. ? Getting regular exercise. ? Not using drugs or products that contain nicotine and tobacco. ? Limiting alcohol use. What can I expect for my preventive care visit? Physical exam Your health care provider may check your:  Height and weight. These may be used to calculate your BMI (body mass index). BMI is a measurement that tells if you are at a healthy weight.  Heart rate and blood pressure.  Body temperature.  Skin for abnormal spots. Counseling Your health care provider may ask you questions about your:  Past medical problems.  Family's medical history.  Alcohol, tobacco, and drug use.  Emotional well-being.  Home life and relationship well-being.  Sexual activity.  Diet, exercise, and sleep habits.  Work and work Statistician.  Access to firearms.  Method of birth control.  Menstrual cycle.  Pregnancy history. What immunizations do I need? Vaccines are usually given at various ages, according to a schedule. Your health care provider will recommend vaccines for you based on your age, medical history, and lifestyle or other factors, such as travel or where you work.   What tests do I need? Blood tests  Lipid and cholesterol levels. These may be checked every 5 years starting at age 64.  Hepatitis C test.  Hepatitis B test. Screening  Diabetes screening. This is done by checking your blood sugar (glucose) after you have not eaten for a while (fasting).  STD (sexually transmitted disease) testing, if you are at risk.  BRCA-related cancer screening. This may  be done if you have a family history of breast, ovarian, tubal, or peritoneal cancers.  Pelvic exam and Pap test. This may be done every 3 years starting at age 61. Starting at age 82, this may be done every 5 years if you have a Pap test in combination with an HPV test. Talk with your health care provider about your test results, treatment options, and if necessary, the need for more tests.   Follow these instructions at home: Eating and drinking  Eat a healthy diet that includes fresh fruits and vegetables, whole grains, lean protein, and low-fat dairy products.  Take vitamin and mineral supplements as recommended by your health care provider.  Do not drink alcohol if: ? Your health care provider tells you not to drink. ? You are pregnant, may be pregnant, or are planning to become pregnant.  If you drink alcohol: ? Limit how much you have to 0-1 drink a day. ? Be aware of how much alcohol is in your drink. In the [place] one drink equals one 12 oz bottle of beer (355 mL), one 5 oz glass of wine (148 mL), or one 1 oz glass of hard liquor (44 mL).   Lifestyle  Take daily care of your teeth and gums. Brush your teeth every morning and night with fluoride toothpaste. Floss one time each day.  Stay active. Exercise for at least 30 minutes 5 or more days each week.  Do not use any products that contain nicotine or tobacco, such as cigarettes, e-cigarettes, and chewing tobacco. If you need help quitting, ask your health care provider.  Do  not use drugs.  If you are sexually active, practice safe sex. Use a condom or other form of protection to prevent STIs (sexually transmitted infections).  If you do not wish to become pregnant, use a form of birth control. If you plan to become pregnant, see your health care provider for a prepregnancy visit.  Find healthy ways to cope with stress, such as: ? Meditation, yoga, or listening to music. ? Journaling. ? Talking to a trusted  person. ? Spending time with friends and family. Safety  Always wear your seat belt while driving or riding in a vehicle.  Do not drive: ? If you have been drinking alcohol. Do not ride with someone who has been drinking. ? When you are tired or distracted. ? While texting.  Wear a helmet and other protective equipment during sports activities.  If you have firearms in your house, make sure you follow all gun safety procedures.  Seek help if you have been physically or sexually abused. What's next?  Go to your health care provider once a year for an annual wellness visit.  Ask your health care provider how often you should have your eyes and teeth checked.  Stay up to date on all vaccines. This information is not intended to replace advice given to you by your health care provider. Make sure you discuss any questions you have with your health care provider. Document Revised: 10/06/2019 Document Reviewed: 10/19/2017 Elsevier Patient Education  2021 [employer].

## 2020-06-02 NOTE — Progress Notes (Signed)
Subjective:     Penny Cooper is a 35 y.o. female and is here for a comprehensive physical exam. The patient reports problems - fatigue and sometimes has trouble with falling or staying asleep..  Social History   Socioeconomic History  . Marital status: Married    Spouse name: Not on file  . Number of children: Not on file  . Years of education: Not on file  . Highest education level: Not on file  Occupational History  . Not on file  Tobacco Use  . Smoking status: Never Smoker  . Smokeless tobacco: Never Used  Vaping Use  . Vaping Use: Never used  Substance and Sexual Activity  . Alcohol use: Yes    Comment: occassionally 1-2 a yr  . Drug use: Never  . Sexual activity: Yes    Birth control/protection: None  Other Topics Concern  . Not on file  Social History Narrative  . Not on file   Social Determinants of Health   Financial Resource Strain: Not on file  Food Insecurity: Not on file  Transportation Needs: Not on file  Physical Activity: Not on file  Stress: Not on file  Social Connections: Not on file  Intimate Partner Violence: Not on file   Health Maintenance  Topic Date Due  . Hepatitis C Screening  Never done  . COVID-19 Vaccine (1) Never done  . HIV Screening  Never done  . INFLUENZA VACCINE  09/21/2020  . PAP SMEAR-Modifier  02/26/2022  . TETANUS/TDAP  01/08/2024  . HPV VACCINES  Aged Out    The following portions of the patient's history were reviewed and updated as appropriate: allergies, current medications, past family history, past medical history, past social history, past surgical history and problem list.  Review of Systems Pertinent items noted in HPI and remainder of comprehensive ROS otherwise negative.   Objective:    BP 102/66   Pulse 84   Temp 97.6 F (36.4 C)   Ht 5\' 2"  (1.575 m)   Wt 182 lb 4.8 oz (82.7 kg)   LMP  (LMP Unknown)   SpO2 96%   BMI 33.34 kg/m  General appearance: alert, cooperative, appears stated age and  no distress Head: Normocephalic, without obvious abnormality, atraumatic Eyes: negative findings: lids and lashes normal, conjunctivae and sclerae normal, corneas clear and pupils equal, round, reactive to light and accomodation Ears: normal TM's and external ear canals both ears Nose: Nares normal. Septum midline. Mucosa normal. No drainage or sinus tenderness. Throat: lips, mucosa, and tongue normal; teeth and gums normal Neck: no adenopathy, supple, symmetrical, trachea midline and thyroid not enlarged, symmetric, no tenderness/mass/nodules Back: symmetric, no curvature. ROM normal. No CVA tenderness. Lungs: clear to auscultation bilaterally Heart: regular rate and rhythm, S1, S2 normal, no murmur, click, rub or gallop Abdomen: soft, non-tender; bowel sounds normal; no masses,  no organomegaly Extremities: extremities normal, atraumatic, no cyanosis or edema Pulses: 2+ and symmetric Skin: Skin color, texture, turgor normal. No rashes or lesions Lymph nodes: Cervical adenopathy: norma; and Supraclavicular adenopathy: normal Neurologic: Grossly normal    Assessment:    Healthy female exam.     Plan:  -Continue to follow up OB/GYN for female exam.  -Will collect labs and notify of results once available.  -Continue good hydration and follow a heart healthy diet. -Recommend to continue with walking regimen and gradually increase physical activity with ultimate goal of 150 mins/wk. -UTD on pap smear and Tdap. Declined Hep C and HIV screenings. -Follow  up in 1 year for CPE and FBW.   See After Visit Summary for Counseling Recommendations

## 2020-06-03 LAB — COMPREHENSIVE METABOLIC PANEL
ALT: 11 IU/L (ref 0–32)
AST: 14 IU/L (ref 0–40)
Albumin/Globulin Ratio: 1.3 (ref 1.2–2.2)
Albumin: 4 g/dL (ref 3.8–4.8)
Alkaline Phosphatase: 70 IU/L (ref 44–121)
BUN/Creatinine Ratio: 11 (ref 9–23)
BUN: 8 mg/dL (ref 6–20)
Bilirubin Total: 0.4 mg/dL (ref 0.0–1.2)
CO2: 21 mmol/L (ref 20–29)
Calcium: 9.2 mg/dL (ref 8.7–10.2)
Chloride: 105 mmol/L (ref 96–106)
Creatinine, Ser: 0.73 mg/dL (ref 0.57–1.00)
Globulin, Total: 3 g/dL (ref 1.5–4.5)
Glucose: 80 mg/dL (ref 65–99)
Potassium: 4 mmol/L (ref 3.5–5.2)
Sodium: 140 mmol/L (ref 134–144)
Total Protein: 7 g/dL (ref 6.0–8.5)
eGFR: 112 mL/min/{1.73_m2} (ref >59)

## 2020-06-03 LAB — LIPID PANEL
Chol/HDL Ratio: 2.9 ratio (ref 0.0–4.4)
Cholesterol, Total: 134 mg/dL (ref 100–199)
HDL: 46 mg/dL (ref >39)
LDL Chol Calc (NIH): 72 mg/dL (ref 0–99)
Triglycerides: 80 mg/dL (ref 0–149)
VLDL Cholesterol Cal: 16 mg/dL (ref 5–40)

## 2020-06-03 LAB — CBC WITH DIFFERENTIAL/PLATELET
Basophils Absolute: 0 10*3/uL (ref 0.0–0.2)
Basos: 0 %
EOS (ABSOLUTE): 0.1 10*3/uL (ref 0.0–0.4)
Eos: 1 %
Hematocrit: 41.4 % (ref 34.0–46.6)
Hemoglobin: 14.3 g/dL (ref 11.1–15.9)
Immature Grans (Abs): 0 10*3/uL (ref 0.0–0.1)
Immature Granulocytes: 0 %
Lymphocytes Absolute: 1.9 10*3/uL (ref 0.7–3.1)
Lymphs: 29 %
MCH: 31 pg (ref 26.6–33.0)
MCHC: 34.5 g/dL (ref 31.5–35.7)
MCV: 90 fL (ref 79–97)
Monocytes Absolute: 0.5 10*3/uL (ref 0.1–0.9)
Monocytes: 8 %
Neutrophils Absolute: 3.9 10*3/uL (ref 1.4–7.0)
Neutrophils: 62 %
Platelets: 258 10*3/uL (ref 150–450)
RBC: 4.62 x10E6/uL (ref 3.77–5.28)
RDW: 11.9 % (ref 11.7–15.4)
WBC: 6.4 10*3/uL (ref 3.4–10.8)

## 2020-06-03 LAB — HEMOGLOBIN A1C
Est. average glucose Bld gHb Est-mCnc: 94 mg/dL
Hgb A1c MFr Bld: 4.9 % (ref 4.8–5.6)

## 2020-06-03 LAB — TSH: TSH: 1.09 u[IU]/mL (ref 0.450–4.500)

## 2020-06-03 LAB — VITAMIN D 25 HYDROXY (VIT D DEFICIENCY, FRACTURES): Vit D, 25-Hydroxy: 48.6 ng/mL (ref 30.0–100.0)

## 2020-06-23 ENCOUNTER — Encounter: Payer: 59 | Admitting: Physician Assistant

## 2020-08-11 ENCOUNTER — Ambulatory Visit (INDEPENDENT_AMBULATORY_CARE_PROVIDER_SITE_OTHER): Payer: 59 | Admitting: Certified Nurse Midwife

## 2020-08-11 ENCOUNTER — Other Ambulatory Visit: Payer: Self-pay

## 2020-08-11 ENCOUNTER — Other Ambulatory Visit (HOSPITAL_COMMUNITY)
Admission: RE | Admit: 2020-08-11 | Discharge: 2020-08-11 | Disposition: A | Discharge status: Home / Self Care | Payer: 59 | Source: Ambulatory Visit | Attending: Certified Nurse Midwife | Admitting: Certified Nurse Midwife

## 2020-08-11 ENCOUNTER — Encounter: Payer: Self-pay | Admitting: Certified Nurse Midwife

## 2020-08-11 VITALS — BP 124/83 | HR 79 | Ht 62.0 in | Wt 185.6 lb

## 2020-08-11 DIAGNOSIS — Z1159 Encounter for screening for other viral diseases: Secondary | ICD-10-CM | POA: Diagnosis not present

## 2020-08-11 DIAGNOSIS — Z01419 Encounter for gynecological examination (general) (routine) without abnormal findings: Secondary | ICD-10-CM

## 2020-08-11 DIAGNOSIS — Z124 Encounter for screening for malignant neoplasm of cervix: Secondary | ICD-10-CM

## 2020-08-11 DIAGNOSIS — Z114 Encounter for screening for human immunodeficiency virus [HIV]: Secondary | ICD-10-CM

## 2020-08-11 MED ORDER — NORETHIN ACE-ETH ESTRAD-FE 1-20 MG-MCG PO TABS: 1.0000 | ORAL_TABLET | Freq: Every day | ORAL | 11 refills | Status: AC

## 2020-08-11 NOTE — Patient Instructions (Signed)
Preventive Care 21-33 Years Old, Female Preventive care refers to lifestyle choices and visits with your health care provider that can promote health and wellness. This includes: A yearly physical exam. This is also called an annual wellness visit. Regular dental and eye exams. Immunizations. Screening for certain conditions. Healthy lifestyle choices, such as: Eating a healthy diet. Getting regular exercise. Not using drugs or products that contain nicotine and tobacco. Limiting alcohol use. What can I expect for my preventive care visit? Physical exam Your health care provider may check your: Height and weight. These may be used to calculate your BMI (body mass index). BMI is a measurement that tells if you are at a healthy weight. Heart rate and blood pressure. Body temperature. Skin for abnormal spots. Counseling Your health care provider may ask you questions about your: Past medical problems. Family's medical history. Alcohol, tobacco, and drug use. Emotional well-being. Home life and relationship well-being. Sexual activity. Diet, exercise, and sleep habits. Work and work environment. Access to firearms. Method of birth control. Menstrual cycle. Pregnancy history. What immunizations do I need?  Vaccines are usually given at various ages, according to a schedule. Your health care provider will recommend vaccines for you based on your age, medicalhistory, and lifestyle or other factors, such as travel or where you work. What tests do I need?  Blood tests Lipid and cholesterol levels. These may be checked every 5 years starting at age 20. Hepatitis C test. Hepatitis B test. Screening Diabetes screening. This is done by checking your blood sugar (glucose) after you have not eaten for a while (fasting). STD (sexually transmitted disease) testing, if you are at risk. BRCA-related cancer screening. This may be done if you have a family history of breast, ovarian, tubal, or  peritoneal cancers. Pelvic exam and Pap test. This may be done every 3 years starting at age 21. Starting at age 30, this may be done every 5 years if you have a Pap test in combination with an HPV test. Talk with your health care provider about your test results, treatment options,and if necessary, the need for more tests. Follow these instructions at home: Eating and drinking  Eat a healthy diet that includes fresh fruits and vegetables, whole grains, lean protein, and low-fat dairy products. Take vitamin and mineral supplements as recommended by your health care provider. Do not drink alcohol if: Your health care provider tells you not to drink. You are pregnant, may be pregnant, or are planning to become pregnant. If you drink alcohol: Limit how much you have to 0-1 drink a day. Be aware of how much alcohol is in your drink. In the [place] one drink equals one 12 oz bottle of beer (355 mL), one 5 oz glass of wine (148 mL), or one 1 oz glass of hard liquor (44 mL).  Lifestyle Take daily care of your teeth and gums. Brush your teeth every morning and night with fluoride toothpaste. Floss one time each day. Stay active. Exercise for at least 30 minutes 5 or more days each week. Do not use any products that contain nicotine or tobacco, such as cigarettes, e-cigarettes, and chewing tobacco. If you need help quitting, ask your health care provider. Do not use drugs. If you are sexually active, practice safe sex. Use a condom or other form of protection to prevent STIs (sexually transmitted infections). If you do not wish to become pregnant, use a form of birth control. If you plan to become pregnant, see your health care   provider for a prepregnancy visit. Find healthy ways to cope with stress, such as: Meditation, yoga, or listening to music. Journaling. Talking to a trusted person. Spending time with friends and family. Safety Always wear your seat belt while driving or riding in a  vehicle. Do not drive: If you have been drinking alcohol. Do not ride with someone who has been drinking. When you are tired or distracted. While texting. Wear a helmet and other protective equipment during sports activities. If you have firearms in your house, make sure you follow all gun safety procedures. Seek help if you have been physically or sexually abused. What's next? Go to your health care provider once a year for an annual wellness visit. Ask your health care provider how often you should have your eyes and teeth checked. Stay up to date on all vaccines. This information is not intended to replace advice given to you by your health care provider. Make sure you discuss any questions you have with your healthcare provider. Document Revised: 10/06/2019 Document Reviewed: 10/19/2017 Elsevier Patient Education  2022 [employer].

## 2020-08-11 NOTE — Addendum Note (Signed)
Addended by: Mechele Claude on: 08/11/2020 09:54 AM   Modules accepted: Orders

## 2020-08-11 NOTE — Progress Notes (Signed)
GYNECOLOGY ANNUAL PREVENTATIVE CARE ENCOUNTER NOTE  History:     Penny Cooper is a 35 y.o. G11P3003 female here for a routine annual gynecologic exam.  Current complaints: none.   Denies abnormal vaginal bleeding, discharge, pelvic pain, problems with intercourse or other gynecologic concerns.     Social Relationship: married  Living:spouse and 3 children ( 10-G/ 8-B/6-g) Work: Engineer, civil (consulting) at [employer]. Exercise: not really  Smoke/Alcohol/drug use: rare alcohol use, denies smoking and drug use.   Gynecologic History Patient's last menstrual period was 08/09/2020 (exact date). Contraception: OCP (estrogen/progesterone) Last Pap: 02/27/2019. Results were: normal with negative HPV Last mammogram: n/a .   Upstream - 08/11/20 0905       Pregnancy Intention Screening   Does the patient want to become pregnant in the next year? No    Does the patient's partner want to become pregnant in the next year? No    Would the patient like to discuss contraceptive options today? No      Contraception Wrap Up   Current Method Oral Contraceptive    End Method Oral Contraceptive    Contraception Counseling Provided No            The pregnancy intention screening data noted above was reviewed. Potential methods of contraception were discussed. The patient elected to proceed with Oral Contraceptive.    Obstetric History OB History  Gravida Para Term Preterm AB Living  3 3 3     3   SAB IAB Ectopic Multiple Live Births               # Outcome Date GA Lbr Len/2nd Weight Sex Delivery Anes PTL Lv  3 Term           2 Term           1 Term             Past Medical History:  Diagnosis Date   Optic neuritis     Past Surgical History:  Procedure Laterality Date   TONSILLECTOMY AND ADENOIDECTOMY     WISDOM TOOTH EXTRACTION      Current Outpatient Medications on File Prior to Visit  Medication Sig Dispense Refill   fluticasone (FLONASE) 50 MCG/ACT nasal spray Place into the  nose.     ibuprofen (ADVIL) 200 MG tablet Take 200 mg by mouth every 6 (six) hours as needed.     JUNEL FE 1/20 1-20 MG-MCG tablet TAKE 1 TABLET BY MOUTH EVERY DAY 84 tablet 0   levocetirizine (XYZAL) 5 MG tablet Take 5 mg by mouth daily as needed.     No current facility-administered medications on file prior to visit.    Allergies  Allergen Reactions   Mango Flavor Hives    Social History:  reports that she has never smoked. She has never used smokeless tobacco. She reports current alcohol use. She reports that she does not use drugs.  Family History  Problem Relation Age of Onset   Diabetes Maternal Grandmother    Hypertension Maternal Grandmother    Cancer Maternal Grandfather    Heart attack Maternal Grandfather    Hypertension Paternal Grandfather    Stroke Paternal Grandfather     The following portions of the patient's history were reviewed and updated as appropriate: allergies, current medications, past family history, past medical history, past social history, past surgical history and problem list.  Review of Systems Pertinent items noted in HPI and remainder of comprehensive ROS otherwise negative.  Physical  Exam:  BP 124/83   Pulse 79   Ht 5\' 2"  (1.575 m)   Wt 185 lb 9.6 oz (84.2 kg)   LMP 08/09/2020 (Exact Date)   BMI 33.95 kg/m  CONSTITUTIONAL: Well-developed, well-nourished female in no acute distress.  HENT:  Normocephalic, atraumatic, External right and left ear normal. Oropharynx is clear and moist EYES: Conjunctivae and EOM are normal. Pupils are equal, round, and reactive to light. No scleral icterus.  NECK: Normal range of motion, supple, no masses.  Normal thyroid.  SKIN: Skin is warm and dry. No rash noted. Not diaphoretic. No erythema. No pallor. MUSCULOSKELETAL: Normal range of motion. No tenderness.  No cyanosis, clubbing, or edema.  2+ distal pulses. NEUROLOGIC: Alert and oriented to person, place, and time. Normal reflexes, muscle tone  coordination.  PSYCHIATRIC: Normal mood and affect. Normal behavior. Normal judgment and thought content. CARDIOVASCULAR: Normal heart rate noted, regular rhythm RESPIRATORY: Clear to auscultation bilaterally. Effort and breath sounds normal, no problems with respiration noted. BREASTS: Symmetric in size. No masses, tenderness, skin changes, nipple drainage, or lymphadenopathy bilaterally.  ABDOMEN: Soft, no distention noted.  No tenderness, rebound or guarding.  PELVIC: Normal appearing external genitalia and urethral meatus; normal appearing vaginal mucosa and cervix.  No abnormal discharge noted.  Pap smear obtained.  Normal uterine size, no other palpable masses, no uterine or adnexal tenderness.  .   Assessment and Plan:    1. Women's annual routine gynecological examination    Pap: Will follow up results of pap smear and manage accordingly. Mammogram : n/a  Labs: hep c/HIV Refills: OPC Referral: none Routine preventative health maintenance measures emphasized. Please refer to After Visit Summary for other counseling recommendations.      08/11/2020, CNM [facility],  [facility]

## 2020-08-12 LAB — HEPATITIS C ANTIBODY: Hep C Virus Ab: <0.1 s/co ratio (ref 0.0–0.9)

## 2020-08-12 LAB — HIV ANTIBODY (ROUTINE TESTING W REFLEX): HIV Screen 4th Generation wRfx: NONREACTIVE

## 2020-08-14 LAB — CYTOLOGY - PAP: Diagnosis: NEGATIVE

## 2020-09-14 ENCOUNTER — Other Ambulatory Visit: Payer: Self-pay

## 2020-09-14 ENCOUNTER — Ambulatory Visit (INDEPENDENT_AMBULATORY_CARE_PROVIDER_SITE_OTHER): Payer: 59 | Admitting: Podiatry

## 2020-09-14 ENCOUNTER — Ambulatory Visit: Payer: 59

## 2020-09-14 DIAGNOSIS — M7742 Metatarsalgia, left foot: Secondary | ICD-10-CM | POA: Diagnosis not present

## 2020-09-14 DIAGNOSIS — M7741 Metatarsalgia, right foot: Secondary | ICD-10-CM | POA: Diagnosis not present

## 2020-09-14 MED ORDER — MELOXICAM 15 MG PO TABS
15.0000 mg | ORAL_TABLET | Freq: Every day | ORAL | 1 refills | Status: DC
Start: 2020-09-14 — End: 2020-11-11

## 2020-09-14 NOTE — Progress Notes (Signed)
   HPI: 35 y.o. female presenting today as a new patient for evaluation of a pulling discomfort to the bilateral forefoot.  Patient states it is not painful but more of a nuisance.  She has noticed this for a few months now.  She denies a history of injury.  Past Medical History:  Diagnosis Date   Optic neuritis      Physical Exam: General: The patient is alert and oriented x3 in no acute distress.  Dermatology: Skin is warm, dry and supple bilateral lower extremities. Negative for open lesions or macerations.  Vascular: Palpable pedal pulses bilaterally. No edema or erythema noted. Capillary refill within normal limits.  Neurological: Epicritic and protective threshold grossly intact bilaterally.   Musculoskeletal Exam: Range of motion within normal limits to all pedal and ankle joints bilateral. Muscle strength 5/5 in all groups bilateral.  Slight pulling sensation with dorsiflexion of the toes bilateral  Radiographic Exam:  Patient declined x-rays today  Assessment: 1.  Morton's metatarsalgia bilateral   Plan of Care:  1. Patient evaluated. 2.  Informed the patient that I believe that her symptoms are coming most likely from her shoes or sandals that she is wearing.  Recommend that she pays close attention to her shoes and sandals to see if a certain pair aggravates her symptoms more than others 3.  Recommend daily stretching exercises/foot massage 4.  Prescription for meloxicam 15 mg daily  5.  Return to clinic as needed  *Came in today with her son, Casimiro Needle (5yrs) and daughter, Dahlia Client (58yrs)      Felecia Shelling, DPM [facility]  Dr. Felecia Shelling, DPM    [address] [place], [place] [address]                Office [phone]  Fax [phone]

## 2020-09-15 ENCOUNTER — Ambulatory Visit: Payer: 59 | Admitting: Podiatry

## 2020-11-11 ENCOUNTER — Other Ambulatory Visit: Payer: Self-pay | Admitting: Podiatry

## 2020-11-18 ENCOUNTER — Ambulatory Visit: Payer: 59 | Admitting: Obstetrics and Gynecology

## 2021-01-05 ENCOUNTER — Other Ambulatory Visit: Payer: Self-pay

## 2021-01-05 ENCOUNTER — Encounter: Payer: Self-pay | Admitting: Physician Assistant

## 2021-01-05 ENCOUNTER — Ambulatory Visit (INDEPENDENT_AMBULATORY_CARE_PROVIDER_SITE_OTHER): Payer: 59 | Admitting: Physician Assistant

## 2021-01-05 VITALS — BP 117/75 | HR 72 | Temp 98.0°F | Wt 192.0 lb

## 2021-01-05 DIAGNOSIS — R1900 Intra-abdominal and pelvic swelling, mass and lump, unspecified site: Secondary | ICD-10-CM | POA: Diagnosis not present

## 2021-01-05 NOTE — Progress Notes (Signed)
Acute Office Visit  Subjective:    Patient ID: Penny Cooper, female    DOB: 01-26-1986, 35 y.o.   MRN: [id number]  Chief Complaint  Patient presents with   Acute Visit    Lump on stomach     HPI Patient is in today for c/o lump underneath left lower ribcage x couple of months. Reports chronic nausea which is associated with her menses. Denies worsening nausea. Denies fever, night sweats, or unintentional weight loss. States first felt lump 5-6 months ago and then didn't feel it again until 3 weeks ago. Reports is able to feel lump standing/stretching a certain way. Lump becomes tender when palpated but otherwise not bothersome. No injury to lump area.    Past Medical History:  Diagnosis Date   Optic neuritis     Past Surgical History:  Procedure Laterality Date   TONSILLECTOMY AND ADENOIDECTOMY     WISDOM TOOTH EXTRACTION      Family History  Problem Relation Age of Onset   Diabetes Maternal Grandmother    Hypertension Maternal Grandmother    Cancer Maternal Grandfather    Heart attack Maternal Grandfather    Hypertension Paternal Grandfather    Stroke Paternal Grandfather     Social History   Socioeconomic History   Marital status: Married    Spouse name: Not on file   Number of children: Not on file   Years of education: Not on file   Highest education level: Not on file  Occupational History   Not on file  Tobacco Use   Smoking status: Never   Smokeless tobacco: Never  Vaping Use   Vaping Use: Never used  Substance and Sexual Activity   Alcohol use: Yes    Comment: occassionally 1-2 a yr   Drug use: Never   Sexual activity: Yes    Birth control/protection: None, Pill  Other Topics Concern   Not on file  Social History Narrative   Not on file   Social Determinants of Health   Financial Resource Strain: Not on file  Food Insecurity: Not on file  Transportation Needs: Not on file  Physical Activity: Not on file  Stress: Not on file   Social Connections: Not on file  Intimate Partner Violence: Not on file    Outpatient Medications Prior to Visit  Medication Sig Dispense Refill   B-D TB SYRINGE .5CC/27GX1/2" 27G X 1/2" 0.5 ML MISC USE 1 SYRINGE PER WEEK     EPINEPHrine 0.3 mg/0.3 mL IJ SOAJ injection See admin instructions.     ibuprofen (ADVIL) 200 MG tablet Take 200 mg by mouth every 6 (six) hours as needed.     levocetirizine (XYZAL) 5 MG tablet Take 5 mg by mouth daily.     meloxicam (MOBIC) 15 MG tablet TAKE 1 TABLET (15 MG TOTAL) BY MOUTH DAILY. (Patient taking differently: Take 15 mg by mouth as needed.) 30 tablet 1   norethindrone-ethinyl estradiol-FE (JUNEL FE 1/20) 1-20 MG-MCG tablet Take 1 tablet by mouth daily. 84 tablet 11   fluticasone (FLONASE) 50 MCG/ACT nasal spray Place into the nose.     JUNEL 1/20 1-20 MG-MCG tablet Take 1 tablet by mouth daily.     No facility-administered medications prior to visit.    Allergies  Allergen Reactions   Mango Flavor Hives    Review of Systems Review of Systems:  A fourteen system review of systems was performed and found to be positive as per HPI.    Objective:  Physical Exam General:  Well Developed, well nourished, in no acute distress   Neuro:  Alert and oriented,  extra-ocular muscles intact  HEENT:  Normocephalic, atraumatic, neck supple, no adenopathy  Skin:  no gross rash, warm, pink. Abdomen: +BS, soft, non-distended, no organomegaly appreciated, ? small and firm mass with deep palpation LUQ, no guarding or rebound tenderness Cardiac:  RRR Respiratory:  CTA B/L w/o wheezing, crackles or rales. Vascular:  Ext warm, no cyanosis apprec.; cap RF less 2 sec. Psych:  No HI/SI, judgement and insight good, Euthymic mood. Full Affect.  BP 117/75   Pulse 72   Temp 98 F (36.7 C)   Wt 192 lb (87.1 kg)   SpO2 98%   BMI 35.12 kg/m  Wt Readings from Last 3 Encounters:  01/05/21 192 lb (87.1 kg)  08/11/20 185 lb 9.6 oz (84.2 kg)  06/02/20 182 lb  4.8 oz (82.7 kg)    Health Maintenance Due  Topic Date Due   COVID-19 Vaccine (3 - Booster for Pfizer series) 12/15/2019    There are no preventive care reminders to display for this patient.   Lab Results  Component Value Date   TSH 1.090 06/02/2020   Lab Results  Component Value Date   WBC 6.4 06/02/2020   HGB 14.3 06/02/2020   HCT 41.4 06/02/2020   MCV 90 06/02/2020   PLT 258 06/02/2020   Lab Results  Component Value Date   NA 140 06/02/2020   K 4.0 06/02/2020   CO2 21 06/02/2020   GLUCOSE 80 06/02/2020   BUN 8 06/02/2020   CREATININE 0.73 06/02/2020   BILITOT 0.4 06/02/2020   ALKPHOS 70 06/02/2020   AST 14 06/02/2020   ALT 11 06/02/2020   PROT 7.0 06/02/2020   ALBUMIN 4.0 06/02/2020   CALCIUM 9.2 06/02/2020   EGFR 112 06/02/2020   Lab Results  Component Value Date   CHOL 134 06/02/2020   Lab Results  Component Value Date   HDL 46 06/02/2020   Lab Results  Component Value Date   LDLCALC 72 06/02/2020   Lab Results  Component Value Date   TRIG 80 06/02/2020   Lab Results  Component Value Date   CHOLHDL 2.9 06/02/2020   Lab Results  Component Value Date   HGBA1C 4.9 06/02/2020       Assessment & Plan:   Problem List Items Addressed This Visit   None Visit Diagnoses     Abdominal mass, unspecified abdominal location    -  Primary   Relevant Orders   US Abdomen Complete   CBC w/Diff   Comp Met (CMET)   Lipase      Etiology unclear, potentially deep lymph node. Will place order for labs and abdominal ultrasound for further evaluation.    No orders of the defined types were placed in this encounter.    Lorrene Reid, PA-C

## 2021-01-07 LAB — COMPREHENSIVE METABOLIC PANEL
ALT: 15 IU/L (ref 0–32)
AST: 15 IU/L (ref 0–40)
Albumin/Globulin Ratio: 1.6 (ref 1.2–2.2)
Albumin: 4.4 g/dL (ref 3.8–4.8)
Alkaline Phosphatase: 97 IU/L (ref 44–121)
BUN/Creatinine Ratio: 12 (ref 9–23)
BUN: 8 mg/dL (ref 6–20)
Bilirubin Total: 0.4 mg/dL (ref 0.0–1.2)
CO2: 24 mmol/L (ref 20–29)
Calcium: 9.5 mg/dL (ref 8.7–10.2)
Chloride: 102 mmol/L (ref 96–106)
Creatinine, Ser: 0.66 mg/dL (ref 0.57–1.00)
Globulin, Total: 2.8 g/dL (ref 1.5–4.5)
Glucose: 111 mg/dL — ABNORMAL HIGH (ref 70–99)
Potassium: 4 mmol/L (ref 3.5–5.2)
Sodium: 143 mmol/L (ref 134–144)
Total Protein: 7.2 g/dL (ref 6.0–8.5)
eGFR: 119 mL/min/{1.73_m2} (ref >59)

## 2021-01-07 LAB — CBC WITH DIFFERENTIAL/PLATELET
Basophils Absolute: 0.1 10*3/uL (ref 0.0–0.2)
Basos: 1 %
EOS (ABSOLUTE): 0.1 10*3/uL (ref 0.0–0.4)
Eos: 1 %
Hematocrit: 44.3 % (ref 34.0–46.6)
Hemoglobin: 15 g/dL (ref 11.1–15.9)
Immature Grans (Abs): 0 10*3/uL (ref 0.0–0.1)
Immature Granulocytes: 0 %
Lymphocytes Absolute: 2.4 10*3/uL (ref 0.7–3.1)
Lymphs: 26 %
MCH: 31.1 pg (ref 26.6–33.0)
MCHC: 33.9 g/dL (ref 31.5–35.7)
MCV: 92 fL (ref 79–97)
Monocytes Absolute: 0.6 10*3/uL (ref 0.1–0.9)
Monocytes: 7 %
Neutrophils Absolute: 6.1 10*3/uL (ref 1.4–7.0)
Neutrophils: 65 %
Platelets: 285 10*3/uL (ref 150–450)
RBC: 4.82 x10E6/uL (ref 3.77–5.28)
RDW: 11.9 % (ref 11.7–15.4)
WBC: 9.3 10*3/uL (ref 3.4–10.8)

## 2021-01-07 LAB — LIPASE: Lipase: 17 U/L (ref 14–72)

## 2021-01-08 ENCOUNTER — Encounter: Payer: Self-pay | Admitting: Physician Assistant

## 2021-01-16 ENCOUNTER — Other Ambulatory Visit: Payer: Self-pay | Admitting: Podiatry

## 2021-01-18 ENCOUNTER — Ambulatory Visit
Admission: RE | Admit: 2021-01-18 | Discharge: 2021-01-18 | Disposition: A | Discharge status: Home / Self Care | Payer: 59 | Source: Ambulatory Visit | Attending: Physician Assistant | Admitting: Physician Assistant

## 2021-01-18 ENCOUNTER — Other Ambulatory Visit: Payer: Self-pay

## 2021-01-18 ENCOUNTER — Encounter: Payer: Self-pay | Admitting: Physician Assistant

## 2021-01-18 DIAGNOSIS — R1900 Intra-abdominal and pelvic swelling, mass and lump, unspecified site: Secondary | ICD-10-CM | POA: Diagnosis present

## 2021-01-25 ENCOUNTER — Other Ambulatory Visit: Payer: Self-pay | Admitting: Nurse Practitioner

## 2021-01-25 DIAGNOSIS — R1900 Intra-abdominal and pelvic swelling, mass and lump, unspecified site: Secondary | ICD-10-CM

## 2021-01-25 NOTE — Telephone Encounter (Signed)
Will you please let the patient know that I placed the order for CT abdomen for further evaluation of palpable mass in the left upper quadrant of abdomen. Negative ultrasound. Thanks so much.  -HB

## 2021-01-25 NOTE — Progress Notes (Signed)
Order placed for CT abdomen for further evaluation of palpable mass in the left upper quadrant of abdomen. Negative ultrasound .

## 2021-02-03 ENCOUNTER — Other Ambulatory Visit: Payer: Self-pay

## 2021-02-03 ENCOUNTER — Ambulatory Visit
Admission: RE | Admit: 2021-02-03 | Discharge: 2021-02-03 | Disposition: A | Discharge status: Home / Self Care | Payer: 59 | Source: Ambulatory Visit | Attending: Nurse Practitioner | Admitting: Nurse Practitioner

## 2021-02-03 ENCOUNTER — Encounter: Payer: Self-pay | Admitting: Nurse Practitioner

## 2021-02-03 DIAGNOSIS — R1900 Intra-abdominal and pelvic swelling, mass and lump, unspecified site: Secondary | ICD-10-CM

## 2021-02-03 MED ORDER — IOPAMIDOL (ISOVUE-300) INJECTION 61%
100.0000 mL | Freq: Once | INTRAVENOUS | Status: AC | PRN
  Administered 2021-02-03: 09:00:00 100 mL via INTRAVENOUS

## 2021-02-03 NOTE — Progress Notes (Signed)
Normal CT of abdomen. Mychart message sent to the patient.

## 2021-03-21 ENCOUNTER — Other Ambulatory Visit: Payer: Self-pay | Admitting: Podiatry

## 2021-03-22 NOTE — Telephone Encounter (Signed)
Please advise 

## 2021-05-09 ENCOUNTER — Emergency Department
Admission: EM | Admit: 2021-05-09 | Discharge: 2021-05-09 | Disposition: A | Discharge status: Home / Self Care | Payer: 59 | Attending: Emergency Medicine | Admitting: Emergency Medicine

## 2021-05-09 ENCOUNTER — Encounter: Payer: Self-pay | Admitting: Emergency Medicine

## 2021-05-09 ENCOUNTER — Emergency Department: Payer: 59

## 2021-05-09 ENCOUNTER — Other Ambulatory Visit: Payer: Self-pay

## 2021-05-09 DIAGNOSIS — S50812A Abrasion of left forearm, initial encounter: Secondary | ICD-10-CM | POA: Diagnosis not present

## 2021-05-09 DIAGNOSIS — S61216A Laceration without foreign body of right little finger without damage to nail, initial encounter: Secondary | ICD-10-CM | POA: Diagnosis not present

## 2021-05-09 DIAGNOSIS — W540XXA Bitten by dog, initial encounter: Secondary | ICD-10-CM | POA: Diagnosis not present

## 2021-05-09 DIAGNOSIS — Z2914 Encounter for prophylactic rabies immune globin: Secondary | ICD-10-CM | POA: Insufficient documentation

## 2021-05-09 DIAGNOSIS — Z203 Contact with and (suspected) exposure to rabies: Secondary | ICD-10-CM | POA: Diagnosis not present

## 2021-05-09 DIAGNOSIS — Z23 Encounter for immunization: Secondary | ICD-10-CM | POA: Diagnosis not present

## 2021-05-09 DIAGNOSIS — S6991XA Unspecified injury of right wrist, hand and finger(s), initial encounter: Secondary | ICD-10-CM | POA: Diagnosis present

## 2021-05-09 MED ORDER — RABIES IMMUNE GLOBULIN 150 UNIT/ML IM INJ
20.0000 [IU]/kg | INJECTION | Freq: Once | INTRAMUSCULAR | Status: AC
  Administered 2021-05-09: 1725 [IU] via INTRAMUSCULAR
  Filled 2021-05-09: qty 11.5

## 2021-05-09 MED ORDER — AMOXICILLIN-POT CLAVULANATE 875-125 MG PO TABS: 1.0000 | ORAL_TABLET | Freq: Two times a day (BID) | ORAL | 0 refills | Status: DC

## 2021-05-09 MED ORDER — TETANUS-DIPHTH-ACELL PERTUSSIS 5-2.5-18.5 LF-MCG/0.5 IM SUSY
0.5000 mL | PREFILLED_SYRINGE | Freq: Once | INTRAMUSCULAR | Status: AC
  Administered 2021-05-09: 0.5 mL via INTRAMUSCULAR
  Filled 2021-05-09: qty 0.5

## 2021-05-09 MED ORDER — RABIES VACCINE, PCEC IM SUSR
1.0000 mL | Freq: Once | INTRAMUSCULAR | Status: AC
  Administered 2021-05-09: 1 mL via INTRAMUSCULAR
  Filled 2021-05-09: qty 1

## 2021-05-09 MED ORDER — AMOXICILLIN-POT CLAVULANATE 875-125 MG PO TABS: 1.0000 | ORAL_TABLET | Freq: Two times a day (BID) | ORAL | 0 refills | Status: AC

## 2021-05-09 NOTE — ED Notes (Signed)
See triage note. Pt has superficial abrasions to L hand above middle finger, R pinkie finger, and one superficial scratch on L anterior forearm from stray dog she was trying to help who was stuck on her porch. Occurred around 450pm. ? ?No puncture wounds at all. ? ?Last tetanus shot was 2015. ? ?Pt ambulatory in NAD. ?

## 2021-05-09 NOTE — ED Provider Notes (Signed)
[facility]EMERGENCY DEPARTMENT  ____________________________________________  Time seen: Approximately 6:02 PM  I have reviewed the triage vital signs and the nursing notes.   HISTORY  Chief Complaint Animal Bite   Historian Patient     HPI Penny Cooper is a 36 y.o. female presents to the emergency department after patient was bitten by a stray dog.  Patient reports that dog was caught between slats in the porch and she was trying to help free it.  Patient states the dog was not available to be quarantined for signs and symptoms of rabies.  Animal control has been contacted.  Patient has small superficial laceration along the dorsal aspect of the right hand and a scratch along the dorsal aspect of the left hand and left forearm.  No numbness or tingling in the upper and lower extremities.  Patient patient reports that her last tetanus shot was in 2015.   Past Medical History:  Diagnosis Date   Optic neuritis      Immunizations up to date:  Yes.     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 Authorizing Provider  amoxicillin-clavulanate (AUGMENTIN) 875-125 MG tablet Take 1 tablet by mouth 2 (two) times daily for 7 days. 05/09/21 05/16/21 Yes Pia Mau M, PA-C  B-D TB SYRINGE .5CC/27GX1/2" 27G X 1/2" 0.5 ML MISC USE 1 SYRINGE PER WEEK 07/24/20   [provider]  EPINEPHrine  0.3 mg/0.3 mL IJ SOAJ injection See admin instructions. 08/04/20   [provider]  fluticasone (FLONASE) 50 MCG/ACT nasal spray Place into the nose. 05/21/19 08/19/20  [provider]  ibuprofen (ADVIL) 200 MG tablet Take 200 mg by mouth every 6 (six) hours as needed.    [provider]  levocetirizine (XYZAL) 5 MG tablet Take 5 mg by mouth daily. 05/18/20   [provider]  meloxicam (MOBIC) 15 MG tablet TAKE 1 TABLET (15 MG TOTAL) BY MOUTH DAILY. 03/22/21   Felecia Shelling, DPM  norethindrone-ethinyl estradiol-FE (JUNEL FE 1/20) 1-20 MG-MCG tablet Take 1 tablet by mouth daily. 08/11/20   Doreene Burke, CNM    Allergies Mango flavor  Family History  Problem Relation Age of Onset   Diabetes Maternal Grandmother    Hypertension Maternal Grandmother    Cancer Maternal Grandfather    Heart attack Maternal Grandfather    Hypertension Paternal Grandfather    Stroke Paternal Grandfather     Social History Social History   Tobacco Use   Smoking status: Never   Smokeless tobacco: Never  Vaping Use   Vaping Use: Never used  Substance Use Topics   Alcohol use: Yes    Comment: occassionally 1-2 a yr   Drug use: Never     Review of Systems  Constitutional: No fever/chills Eyes:  No discharge ENT: No upper respiratory complaints. Respiratory: no cough. No SOB/ use of accessory muscles to breath Gastrointestinal:  No nausea, no vomiting.  No diarrhea.  No constipation. Musculoskeletal: Negative for musculoskeletal pain. Skin: Patient has dog bite wounds.     ____________________________________________   PHYSICAL EXAM:  VITAL SIGNS: ED Triage Vitals  Enc Vitals Group     BP 05/09/21 1718 (!) 151/84     Pulse Rate 05/09/21 1718 77     Resp 05/09/21 1718 16     Temp 05/09/21 1718 98.4 F (36.9 C)     Temp Source 05/09/21 1718 Oral     SpO2 05/09/21 1718 98 %     Weight 05/09/21 1719 190 lb (86.2 kg)     Height 05/09/21 1719 5\' 2"  (1.575  m)     Head Circumference --      Peak Flow --      Pain Score 05/09/21 1718 2     Pain Loc --      Pain Edu? --      Excl. in GC? --      Constitutional: Alert and oriented. Well appearing and in no acute distress. Eyes: Conjunctivae are normal. PERRL. EOMI. Head: Atraumatic. ENT:      Nose: No congestion/rhinnorhea.      Mouth/Throat: Mucous membranes are moist.  Neck: No stridor.  No cervical spine tenderness to palpation. Cardiovascular: Normal rate, regular rhythm. Normal S1 and S2.  Good peripheral circulation. Respiratory: Normal respiratory effort without tachypnea or retractions. Lungs CTAB. Good air entry to the bases with no decreased or absent breath sounds Gastrointestinal: Bowel sounds x 4 quadrants. Soft and nontender to palpation. No guarding or rigidity. No distention. Musculoskeletal: Full range of motion to all extremities. No obvious deformities noted Neurologic:  Normal for age. No gross focal neurologic deficits are appreciated.  Skin: Patient has dog bite wound to right fifth digit and scratches along the dorsal aspect of left hand and left forearm. Psychiatric: Mood and affect are normal for age. Speech and behavior are normal.   ____________________________________________   LABS (all labs ordered are listed, but only abnormal results are displayed)  Labs Reviewed - No data to display ____________________________________________  EKG   ____________________________________________  RADIOLOGY Geraldo Pitter, personally viewed and evaluated these images (plain radiographs) as part of my medical decision making, as well as reviewing the written report by the radiologist.    DG Hand Complete Right  Result Date: 05/09/2021 CLINICAL DATA:  Dog bite. EXAM: RIGHT HAND - COMPLETE 3+ VIEW COMPARISON:  None. FINDINGS: There is no evidence of fracture or dislocation. Well corticated density distal to the ulna styloid may represent sequela of remote injury  or an accessory ossicle. There is no evidence of arthropathy or other focal bone abnormality. No soft tissue gas. No radiopaque foreign body. Soft tissues are unremarkable. IMPRESSION: No acute fracture, dislocation, or radiopaque foreign body. Electronically Signed   By: Narda Rutherford M.D.   On: 05/09/2021 18:16    ____________________________________________    PROCEDURES  Procedure(s) performed:     Procedures     Medications  Tdap (BOOSTRIX) injection 0.5 mL (0.5 mLs Intramuscular Given 05/09/21 1847)  rabies vaccine (RABAVERT) injection 1 mL (1 mL Intramuscular Given 05/09/21 1850)  rabies immune globulin (HYPERAB/KEDRAB) injection 1,725 Units (1,725 Units Intramuscular Given 05/09/21 1922)     ____________________________________________   INITIAL IMPRESSION / ASSESSMENT AND PLAN / ED COURSE  Pertinent labs & imaging results that were available during my care of the patient were reviewed by me and considered in my medical decision making (see chart  for details).       Assessment and plan Animal bite 36 year old female with unremarkable past medical history presents to the emergency department after she was bitten by a stray dog.  Patient was mildly hypertensive at triage but vital signs were otherwise reassuring.  Patient had small, superficial laceration along the dorsal aspect of right fifth digit and scratches along dorsal aspect of left hand and left forearm.  States that she copiously irrigated her wounds prior to presenting to the emergency department  We will update tetanus status in the emergency department.  As animal is not currently detained for quarantine, will initiate rabies vaccine series.  I gave patient follow-up instructions to seek care at [facility] urgent care and [place] for subsequent rabies vaccines at 3, 7 and 14 days from today.  Patient was started on Augmentin to be taken twice daily for the next 7 days.  Return precautions were given to return  with new or worsening symptoms.    ____________________________________________  FINAL CLINICAL IMPRESSION(S) / ED DIAGNOSES  Final diagnoses:  Dog bite, initial encounter      NEW MEDICATIONS STARTED DURING THIS VISIT:  ED Discharge Orders          Ordered    amoxicillin-clavulanate (AUGMENTIN) 875-125 MG tablet  2 times daily        05/09/21 1923                This chart was dictated using voice recognition software/Dragon. Despite best efforts to proofread, errors can occur which can change the meaning. Any change was purely unintentional.     Orvil Feil, PA-C 05/09/21 1931    Sharman Cheek, MD 05/11/21 1018

## 2021-05-09 NOTE — ED Triage Notes (Signed)
Pt via POV from home. Pt c/o animal bite to the R pinky and L hand and scratches to her L forearm. States it was a [employer] data [employer]. Pt has already called animal control. Pt is A&OX4 and NAD.  ?

## 2021-05-09 NOTE — Discharge Instructions (Signed)
Please go to [facility] urgent care at [address] or [place] for rabies vaccines on March 22, March 29 and April 5. ?Take Augmentin twice daily for the next 7 days. ?

## 2021-05-12 ENCOUNTER — Other Ambulatory Visit: Payer: Self-pay

## 2021-05-12 ENCOUNTER — Ambulatory Visit
Admission: RE | Admit: 2021-05-12 | Discharge: 2021-05-12 | Disposition: A | Discharge status: Home / Self Care | Payer: 59 | Source: Ambulatory Visit | Attending: Physician Assistant | Admitting: Physician Assistant

## 2021-05-12 DIAGNOSIS — Z23 Encounter for immunization: Secondary | ICD-10-CM

## 2021-05-16 ENCOUNTER — Other Ambulatory Visit: Payer: Self-pay

## 2021-05-16 ENCOUNTER — Ambulatory Visit
Admission: RE | Admit: 2021-05-16 | Discharge: 2021-05-16 | Disposition: A | Discharge status: Home / Self Care | Payer: 59 | Source: Ambulatory Visit | Attending: Emergency Medicine | Admitting: Emergency Medicine

## 2021-05-16 DIAGNOSIS — Z203 Contact with and (suspected) exposure to rabies: Secondary | ICD-10-CM | POA: Diagnosis not present

## 2021-05-16 MED ORDER — RABIES VACCINE, PCEC IM SUSR
1.0000 mL | Freq: Once | INTRAMUSCULAR | Status: AC
  Administered 2021-05-16: 1 mL via INTRAMUSCULAR

## 2021-05-16 NOTE — ED Triage Notes (Signed)
Pt here for rabies shot 3 of 4.  ?

## 2021-05-23 ENCOUNTER — Ambulatory Visit
Admission: EM | Admit: 2021-05-23 | Discharge: 2021-05-23 | Disposition: A | Discharge status: Home / Self Care | Payer: 59 | Attending: Family Medicine | Admitting: Family Medicine

## 2021-05-23 VITALS — BP 128/79 | HR 109 | Temp 98.2°F | Resp 18

## 2021-05-23 DIAGNOSIS — Z203 Contact with and (suspected) exposure to rabies: Secondary | ICD-10-CM | POA: Diagnosis not present

## 2021-05-23 DIAGNOSIS — Z23 Encounter for immunization: Secondary | ICD-10-CM | POA: Insufficient documentation

## 2021-05-23 DIAGNOSIS — J029 Acute pharyngitis, unspecified: Secondary | ICD-10-CM | POA: Insufficient documentation

## 2021-05-23 LAB — POCT RAPID STREP A (OFFICE): Rapid Strep A Screen: NEGATIVE

## 2021-05-23 MED ORDER — RABIES VACCINE, PCEC IM SUSR
1.0000 mL | Freq: Once | INTRAMUSCULAR | Status: AC
  Administered 2021-05-23: 1 mL via INTRAMUSCULAR

## 2021-05-23 NOTE — ED Provider Notes (Signed)
?[facility] ? ? ? ?CSN: [id number] ?Arrival date & time: 05/23/21  0848 ? ? ?  ? ?History   ?Chief Complaint ?Chief Complaint  ?Patient presents with  ? Rabies Vaccine  ? Sore Throat  ? ? ?HPI ?Penny Cooper is a 36 y.o. female.  ? ?HPI ?Patient presents today for her final rabies vaccine related to an animal bite.  She also request to be evaluated for sore throat.  Reports sore throat symptoms started on yesterday.  Patient is concerned that she may have strep.  She has no other associated URI symptoms.  No history of recurrent strep.  Patient suffers from seasonal allergies and endorses bilateral ear fullness.  No overt ear pain.  She denies any symptoms of congestion and postnasal drainage. ?Past Medical History:  ?Diagnosis Date  ? Optic neuritis   ? ? ?Patient Active Problem List  ? Diagnosis Date Noted  ? Deviated septum 05/21/2019  ? Nasal turbinate hypertrophy 05/21/2019  ? Snoring 03/20/2019  ? Sleep deprivation 03/20/2019  ? 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 History of snoring 01/03/2019  ? BMI 32.0-32.9,adult 01/03/2019  ? Healthcare maintenance 01/02/2019  ? H/O optic neuritis 09/23/2015  ? ? ?Past Surgical History:  ?Procedure Laterality Date  ? TONSILLECTOMY AND ADENOIDECTOMY    ? WISDOM TOOTH EXTRACTION    ? ? ?OB History   ? ? Gravida  ?3  ? Para  ?3  ? Term  ?3  ? Preterm  ?   ? AB  ?   ? Living  ?3  ?  ? ? SAB  ?   ? IAB  ?   ? Ectopic  ?   ? Multiple  ?   ? Live Births  ?   ?   ?  ?  ? ? ? ?Home Medications   ? ?Prior to Admission medications   ?Medication Sig Start Date End Date Taking? Authorizing Provider  ?B-D TB SYRINGE .5CC/27GX1/2" 27G X 1/2" 0.5 ML MISC USE 1 SYRINGE PER WEEK 07/24/20   [provider]  ?EPINEPHrine 0.3 mg/0.3 mL IJ SOAJ injection See admin instructions. 08/04/20   [provider]  ?fluticasone (FLONASE) 50 MCG/ACT nasal spray Place into the nose. 05/21/19 08/19/20  [provider]  ?ibuprofen (ADVIL) 200 MG  tablet Take 200 mg by mouth every 6 (six) hours as needed.    [provider]  ?levocetirizine (XYZAL) 5 MG tablet Take 5 mg by mouth daily. 05/18/20   [provider]  ?meloxicam (MOBIC) 15 MG tablet TAKE 1 TABLET (15 MG TOTAL) BY MOUTH DAILY. 03/22/21   Felecia Shelling, DPM  ?norethindrone-ethinyl estradiol-FE (JUNEL FE 1/20) 1-20 MG-MCG tablet Take 1 tablet by mouth daily. 08/11/20   Doreene Burke, CNM  ? ? ?Family History ?Family History  ?Problem Relation Age of Onset  ? Diabetes Maternal Grandmother   ? Hypertension Maternal Grandmother   ? Cancer Maternal Grandfather   ? Heart attack Maternal Grandfather   ? Hypertension Paternal Grandfather   ? Stroke Paternal Grandfather   ? ? ?Social History ?Social History  ? ?Tobacco Use  ? Smoking status: Never  ? Smokeless tobacco: Never  ?Vaping Use  ? Vaping Use: Never used  ?Substance Use Topics  ? Alcohol use: Yes  ?  Comment: occassionally 1-2 a yr  ? Drug use: Never  ? ? ? ?Allergies   ?Mango flavor ? ? ?Review of Systems ?Review of Systems ?Pertinent negatives listed in HPI  ? ?  Physical Exam ?Triage Vital Signs ?ED Triage Vitals  ?Enc Vitals Group  ?   BP 05/23/21 0858 128/79  ?   Pulse Rate 05/23/21 0858 (!) 109  ?   Resp 05/23/21 0858 18  ?   Temp 05/23/21 0858 98.2 ?F (36.8 ?C)  ?   Temp Source 05/23/21 0858 Oral  ?   SpO2 05/23/21 0858 97 %  ?   Weight --   ?   Height --   ?   Head Circumference --   ?   Peak Flow --   ?   Pain Score 05/23/21 0856 9  ?   Pain Loc --   ?   Pain Edu? --   ?   Excl. in GC? --   ? ?No data found. ? ?Updated Vital Signs ?BP 128/79 (BP Location: Left Arm)   Pulse (!) 109   Temp 98.2 ?F (36.8 ?C) (Oral)   Resp 18   LMP  (LMP Unknown)   SpO2 97%  ? ?Visual Acuity ?Right Eye Distance:   ?Left Eye Distance:   ?Bilateral Distance:   ? ?Right Eye Near:   ?Left Eye Near:    ?Bilateral Near:    ? ?Physical Exam ?General Appearance:    Alert, cooperative, no distress  ?HENT:  Normocephalic, both sides TM fluid noted,  neck without nodes, throat normal without erythema or exudate, and nasal mucosa congested  ?Eyes:    PERRL, conjunctiva/corneas clear, EOM's intact       ?Lungs:     Clear to auscultation bilaterally, respirations unlabored  ?Heart:    Regular rate and rhythm  ?Neurologic:   Awake, alert, oriented x 3. No apparent focal neurological           defect.   ?   ?  ? ?UC Treatments / Results  ?Labs ?(all labs ordered are listed, but only abnormal results are displayed) ?Labs Reviewed  ?CULTURE, GROUP A STREP [facility])  ?POCT RAPID STREP A (OFFICE)  ? ? ?EKG ? ? ?Radiology ?No results found. ? ?Procedures ?Procedures (including critical care time) ? ?Medications Ordered in UC ?Medications  ?rabies vaccine (RABAVERT) injection 1 mL (1 mL Intramuscular Given 05/23/21 0904)  ? ? ?Initial Impression / Assessment and Plan / UC Course  ?I have reviewed the triage vital signs and the nursing notes. ? ?Pertinent labs & imaging results that were available during my care of the patient were reviewed by me and considered in my medical decision making (see chart for details). ? ?  ?Rapid Strep negative. Throat culture pending, low concern for strep based on appearance of throat. Suspect reaction to outdoor allergens.  Continue allergy treatment.  Recommend conservative treatment with gargles.  Return as needed. ? ?Final Clinical Impressions(s) / UC Diagnoses  ? ?Final diagnoses:  ?Acute pharyngitis, unspecified etiology  ?Need for immunization against rabies  ? ? ? ?Discharge Instructions   ? ?  ?Rapid strep is negative.  Recommend conservative management of sore throat with: ?If you have a sore or scratchy throat, use a saltwater gargle- ? to ? teaspoon of salt dissolved in a 4-ounce to 8-ounce glass of warm water.  Gargle the solution for approximately 15-30 seconds and then spit.  It is important not to swallow the solution.  You can also use throat lozenges/cough drops and Chloraseptic spray to help with throat pain or discomfort.   Warm or cold liquids can also be helpful in relieving throat pain.  ?Throat culture is pending once  resulted if any additional treatment is warranted we will notify you by phone.  Continue allergy medication as postnasal drainage and outdoor allergens can cause irritation of the throat ? ? ? ? ?ED Prescriptions   ?None ?  ? ?PDMP not reviewed this encounter. ?  ?Bing NeighborsHarris, Jarman Litton S, FNP ?05/23/21 [phone] ? ?

## 2021-05-23 NOTE — Discharge Instructions (Addendum)
Rapid strep is negative.  Recommend conservative management of sore throat with: ?If you have a sore or scratchy throat, use a saltwater gargle- ? to ? teaspoon of salt dissolved in a 4-ounce to 8-ounce glass of warm water.  Gargle the solution for approximately 15-30 seconds and then spit.  It is important not to swallow the solution.  You can also use throat lozenges/cough drops and Chloraseptic spray to help with throat pain or discomfort.  Warm or cold liquids can also be helpful in relieving throat pain.  ?Throat culture is pending once resulted if any additional treatment is warranted we will notify you by phone.  Continue allergy medication as postnasal drainage and outdoor allergens can cause irritation of the throat ?

## 2021-05-23 NOTE — ED Triage Notes (Signed)
Pt presents with her final rabies vaccine. She also has a ST since yesterday.  ?

## 2021-05-24 ENCOUNTER — Ambulatory Visit
Admission: EM | Admit: 2021-05-24 | Discharge: 2021-05-24 | Disposition: A | Discharge status: Home / Self Care | Payer: 59 | Attending: Emergency Medicine | Admitting: Emergency Medicine

## 2021-05-24 ENCOUNTER — Encounter: Payer: Self-pay | Admitting: Emergency Medicine

## 2021-05-24 DIAGNOSIS — J020 Streptococcal pharyngitis: Secondary | ICD-10-CM

## 2021-05-24 LAB — POCT RAPID STREP A (OFFICE): Rapid Strep A Screen: POSITIVE — AB

## 2021-05-24 MED ORDER — AMOXICILLIN 500 MG PO CAPS: 500.0000 mg | ORAL_CAPSULE | Freq: Two times a day (BID) | ORAL | 0 refills | Status: AC

## 2021-05-24 NOTE — ED Triage Notes (Signed)
Pt was seen yesterday for ST and is not better. Pt states she now has sores on her tongue.  ?

## 2021-05-24 NOTE — Discharge Instructions (Addendum)
You have strep throat.  Take the amoxicillin as directed.  Follow up with your primary care provider if your symptoms are not improving.   ? ?

## 2021-05-24 NOTE — ED Provider Notes (Signed)
?[facility] ? ? ? ?CSN: [id number] ?Arrival date & time: 05/24/21  1140 ? ? ?  ? ?History   ?Chief Complaint ?Chief Complaint  ?Patient presents with  ? Sore Throat  ? ? ?HPI ?Penny Cooper is a 36 y.o. female.  Patient presents with 2-day history of sore throat.  Her sore throat got worse last night.  She noticed white spots on her tonsils, sores on her tongue, and she developed a fever.  Tmax 103.  She has been taking Tylenol and ibuprofen.  She denies rash, cough, shortness of breath, vomiting, diarrhea, or other symptoms.  Patient was seen at this urgent care yesterday for acute pharyngitis; strep negative and culture pending; treated symptomatically. ? ?The history is provided by the patient and medical records.  ? ?Past Medical History:  ?Diagnosis Date  ? Optic neuritis   ? ? ?Patient Active Problem List  ? Diagnosis Date Noted  ? Deviated septum 05/21/2019  ? Nasal turbinate hypertrophy 05/21/2019  ? Snoring 03/20/2019  ? Sleep deprivation 03/20/2019  ? 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 History of snoring 01/03/2019  ? BMI 32.0-32.9,adult 01/03/2019  ? Healthcare maintenance 01/02/2019  ? H/O optic neuritis 09/23/2015  ? ? ?Past Surgical History:  ?Procedure Laterality Date  ? TONSILLECTOMY AND ADENOIDECTOMY    ? WISDOM TOOTH EXTRACTION    ? ? ?OB History   ? ? Gravida  ?3  ? Para  ?3  ? Term  ?3  ? Preterm  ?   ? AB  ?   ? Living  ?3  ?  ? ? SAB  ?   ? IAB  ?   ? Ectopic  ?   ? Multiple  ?   ? Live Births  ?   ?   ?  ?  ? ? ? ?Home Medications   ? ?Prior to Admission medications   ?Medication Sig Start Date End Date Taking? Authorizing Provider  ?amoxicillin (AMOXIL) 500 MG capsule Take 1 capsule (500 mg total) by mouth 2 (two) times daily for 10 days. 05/24/21 06/03/21 Yes Sharion Balloon, NP  ?B-D TB SYRINGE .5CC/27GX1/2" 27G X 1/2" 0.5 ML MISC USE 1 SYRINGE PER WEEK 07/24/20   [provider]  ?EPINEPHrine 0.3 mg/0.3 mL IJ SOAJ injection See admin  instructions. 08/04/20   [provider]  ?fluticasone (FLONASE) 50 MCG/ACT nasal spray Place into the nose. 05/21/19 08/19/20  [provider]  ?ibuprofen (ADVIL) 200 MG tablet Take 200 mg by mouth every 6 (six) hours as needed.    [provider]  ?levocetirizine (XYZAL) 5 MG tablet Take 5 mg by mouth daily. 05/18/20   [provider]  ?meloxicam (MOBIC) 15 MG tablet TAKE 1 TABLET (15 MG TOTAL) BY MOUTH DAILY. 03/22/21   Edrick Kins, DPM  ?norethindrone-ethinyl estradiol-FE (JUNEL FE 1/20) 1-20 MG-MCG tablet Take 1 tablet by mouth daily. 08/11/20   Philip Aspen, CNM  ? ? ?Family History ?Family History  ?Problem Relation Age of Onset  ? Diabetes Maternal Grandmother   ? Hypertension Maternal Grandmother   ? Cancer Maternal Grandfather   ? Heart attack Maternal Grandfather   ? Hypertension Paternal Grandfather   ? Stroke Paternal Grandfather   ? ? ?Social History ?Social History  ? ?Tobacco Use  ? Smoking status: Never  ? Smokeless tobacco: Never  ?Vaping Use  ? Vaping Use: Never used  ?Substance Use Topics  ? Alcohol use: Yes  ?  Comment:  occassionally 1-2 a yr  ? Drug use: Never  ? ? ? ?Allergies   ?Mango flavor ? ? ?Review of Systems ?Review of Systems  ?Constitutional:  Positive for fever. Negative for chills.  ?HENT:  Positive for sore throat. Negative for ear pain, trouble swallowing and voice change.   ?Respiratory:  Negative for cough and shortness of breath.   ?Cardiovascular:  Negative for chest pain and palpitations.  ?Gastrointestinal:  Negative for diarrhea and vomiting.  ?Skin:  Negative for color change and rash.  ?All other systems reviewed and are negative. ? ? ?Physical Exam ?Triage Vital Signs ?ED Triage Vitals [05/24/21 1152]  ?Enc Vitals Group  ?   BP 115/78  ?   Pulse Rate (!) 103  ?   Resp 18  ?   Temp 98.2 ?F (36.8 ?C)  ?   Temp src   ?   SpO2 97 %  ?   Weight   ?   Height   ?   Head Circumference   ?   Peak Flow   ?   Pain Score   ?   Pain Loc   ?   Pain  Edu?   ?   Excl. in GC?   ? ?No data found. ? ?Updated Vital Signs ?BP 115/78   Pulse (!) 103   Temp 98.2 ?F (36.8 ?C)   Resp 18   LMP  (LMP Unknown)   SpO2 97%  ? ?Visual Acuity ?Right Eye Distance:   ?Left Eye Distance:   ?Bilateral Distance:   ? ?Right Eye Near:   ?Left Eye Near:    ?Bilateral Near:    ? ?Physical Exam ?Vitals and nursing note reviewed.  ?Constitutional:   ?   General: She is not in acute distress. ?   Appearance: She is well-developed. She is not ill-appearing.  ?HENT:  ?   Right Ear: Tympanic membrane normal.  ?   Left Ear: Tympanic membrane normal.  ?   Nose: Nose normal.  ?   Mouth/Throat:  ?   Mouth: Mucous membranes are moist.  ?   Pharynx: Oropharyngeal exudate and posterior oropharyngeal erythema present.  ?Eyes:  ?   Conjunctiva/sclera: Conjunctivae normal.  ?Cardiovascular:  ?   Rate and Rhythm: Normal rate and regular rhythm.  ?   Heart sounds: Normal heart sounds.  ?Pulmonary:  ?   Effort: Pulmonary effort is normal. No respiratory distress.  ?   Breath sounds: Normal breath sounds.  ?Musculoskeletal:  ?   Cervical back: Neck supple.  ?Skin: ?   General: Skin is warm and dry.  ?Neurological:  ?   Mental Status: She is alert.  ?Psychiatric:     ?   Mood and Affect: Mood normal.     ?   Behavior: Behavior normal.  ? ? ? ?UC Treatments / Results  ?Labs ?(all labs ordered are listed, but only abnormal results are displayed) ?Labs Reviewed  ?POCT RAPID STREP A (OFFICE) - Abnormal; Notable for the following components:  ?    Result Value  ? Rapid Strep A Screen Positive (*)   ? All other components within normal limits  ? ? ?EKG ? ? ?Radiology ?No results found. ? ?Procedures ?Procedures (including critical care time) ? ?Medications Ordered in UC ?Medications - No data to display ? ?Initial Impression / Assessment and Plan / UC Course  ?I have reviewed the triage vital signs and the nursing notes. ? ?Pertinent labs & imaging results that were available  during my care of the patient  were reviewed by me and considered in my medical decision making (see chart for details). ? ? Strep pharyngitis.  Rapid strep positive.  Treating with amoxicillin.  Discussed Tylenol or ibuprofen as needed for fever or discomfort.  Instructed patient to follow up with her PCP if her symptoms are not improving.  She agrees to plan of care.  ? ? ? ?Final Clinical Impressions(s) / UC Diagnoses  ? ?Final diagnoses:  ?Strep pharyngitis  ? ? ? ?Discharge Instructions   ? ?  ?You have strep throat.  Take the amoxicillin as directed.  Follow up with your primary care provider if your symptoms are not improving.   ? ? ? ? ? ?ED Prescriptions   ? ? Medication Sig Dispense Auth. Provider  ? amoxicillin (AMOXIL) 500 MG capsule Take 1 capsule (500 mg total) by mouth 2 (two) times daily for 10 days. 20 capsule Sharion Balloon, NP  ? ?  ? ?PDMP not reviewed this encounter. ?  ?Sharion Balloon, NP ?05/24/21 1235 ? ?

## 2021-05-26 LAB — CULTURE, GROUP A STREP (THRC)

## 2021-06-03 ENCOUNTER — Encounter: Payer: 59 | Admitting: Physician Assistant

## 2021-06-16 ENCOUNTER — Encounter: Payer: 59 | Admitting: Physician Assistant

## 2021-08-17 ENCOUNTER — Encounter: Payer: Self-pay | Admitting: Certified Nurse Midwife

## 2021-09-19 ENCOUNTER — Other Ambulatory Visit: Payer: Self-pay | Admitting: Podiatry

## 2023-02-19 IMAGING — CT CT ABDOMEN W/ CM
1 of 2 series · 14 of 32 positions shown, 19 images · IV contrast (iopamidol)
Comparison: Ultrasound January 18, 2021.

CLINICAL DATA: Left upper quadrant abdominal pain with left-sided
bulge, times 2 months

EXAM:
CT ABDOMEN WITH CONTRAST
TECHNIQUE: Multidetector CT imaging of the abdomen was performed using the
standard protocol following bolus administration of intravenous
contrast.
CONTRAST:  100mL Z5L6DP-3HH IOPAMIDOL (Z5L6DP-3HH) INJECTION 61%

[Series 2: a/p w/ 5mm · axial · 0.93mm/px · z∈[-279,-19]mm · 14 of 58 slices shown, 19 images]
[im 3/58  soft-tissue]
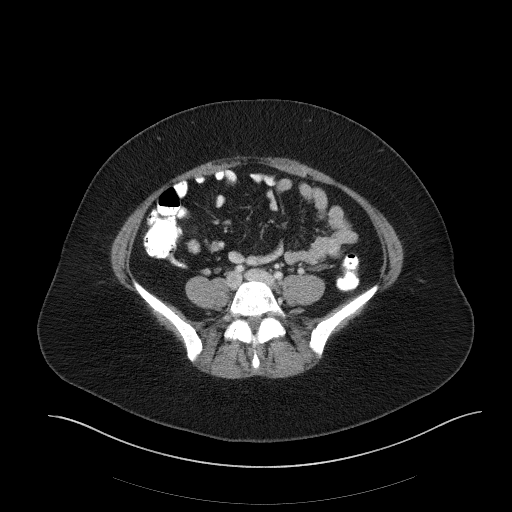
[im 3/58  bone]
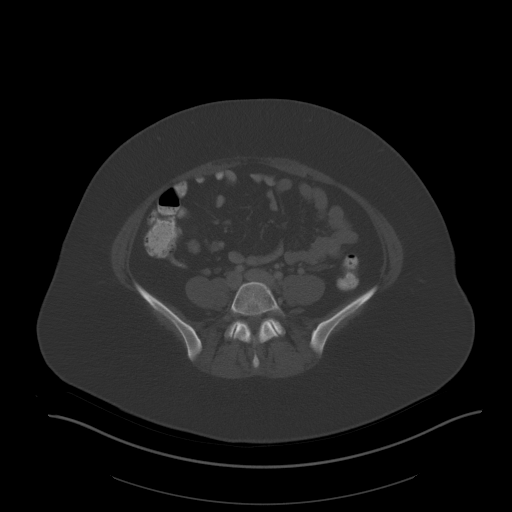
[im 9/58  soft-tissue]
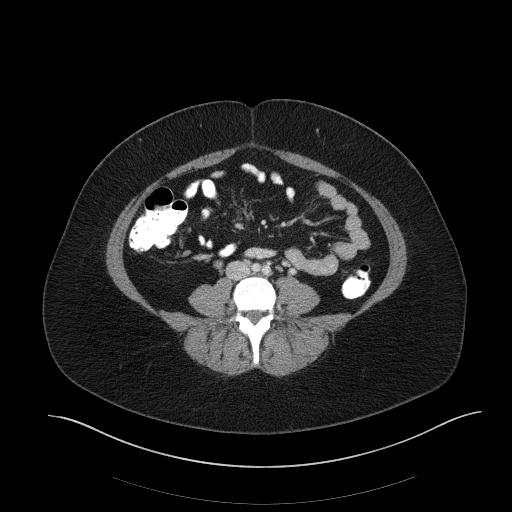
[im 12/58  soft-tissue]
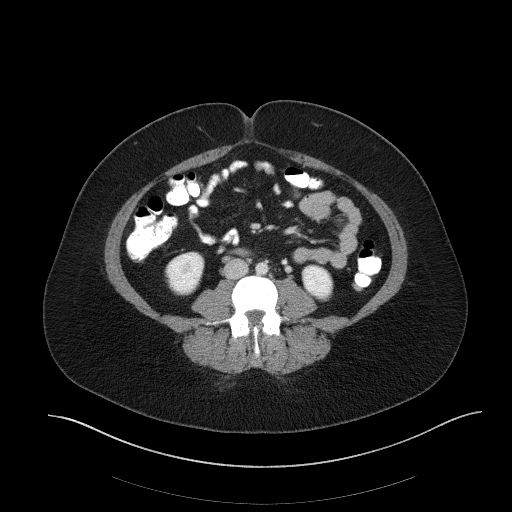
[im 18/58  soft-tissue]
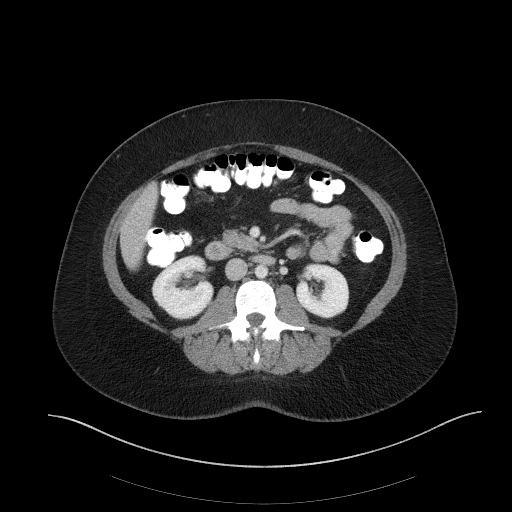
[im 20/58  soft-tissue]
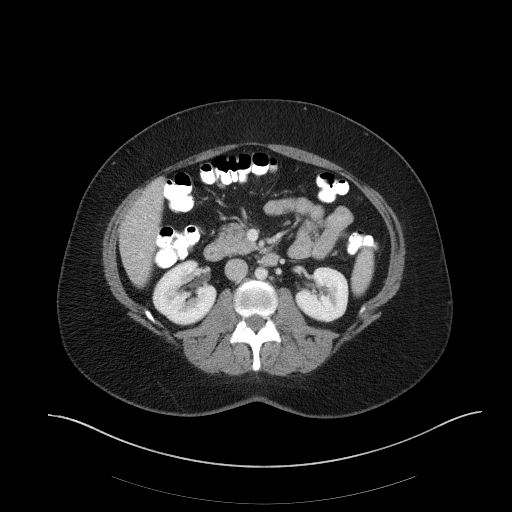
[im 26/58  soft-tissue]
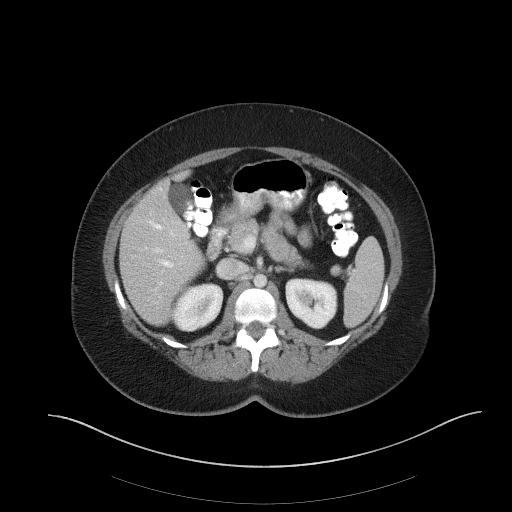
[im 29/58  soft-tissue]
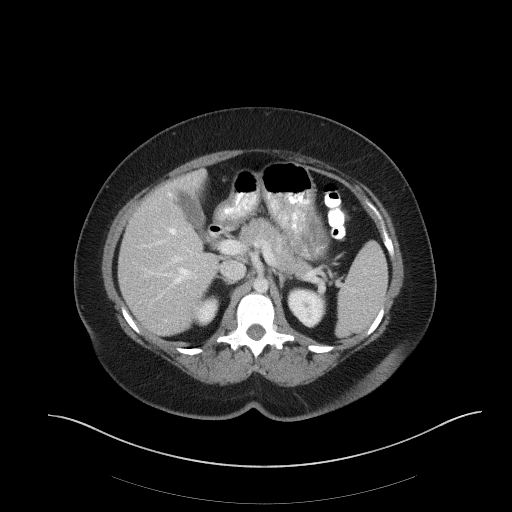
[im 32/58  soft-tissue]
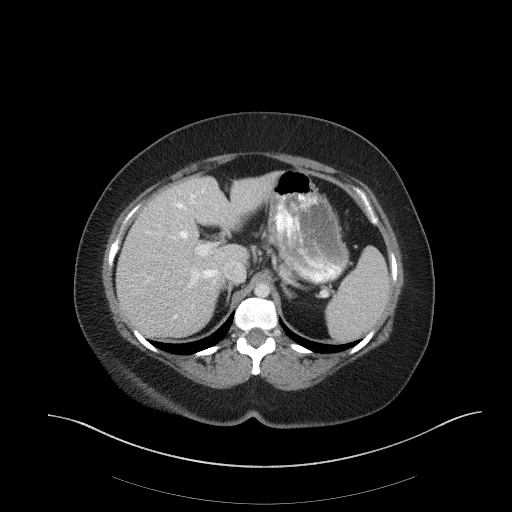
[im 38/58  soft-tissue]
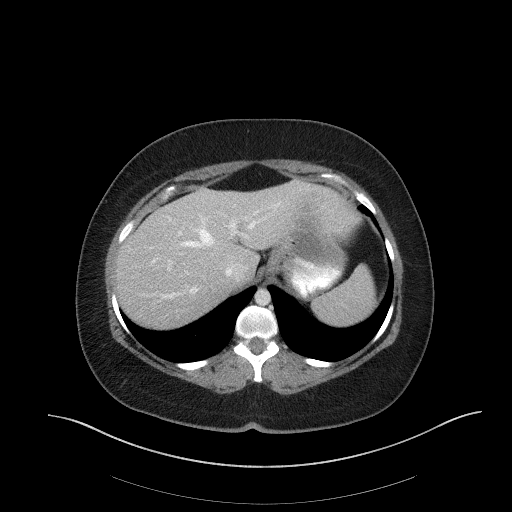
[im 38/58  bone]
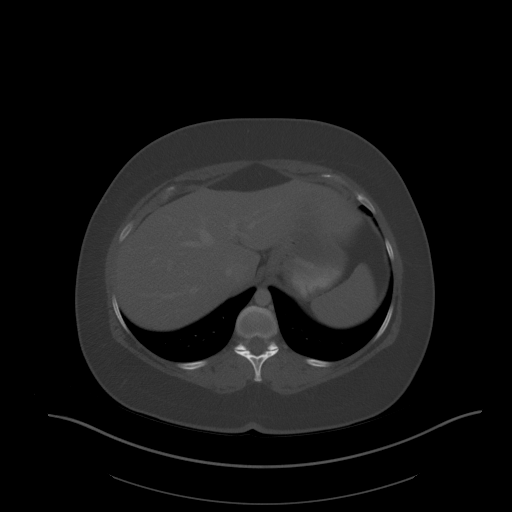
[im 40/58  soft-tissue]
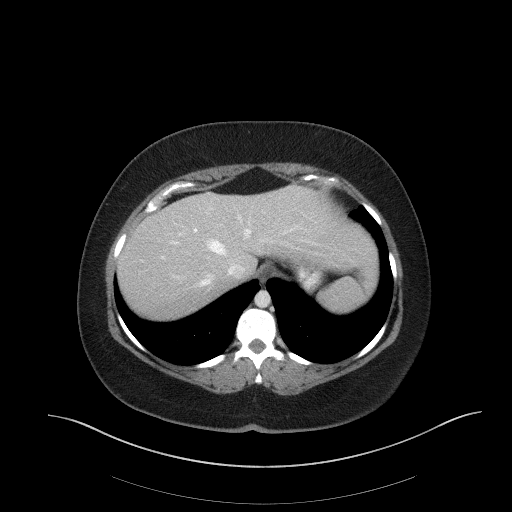
[im 46/58  soft-tissue]
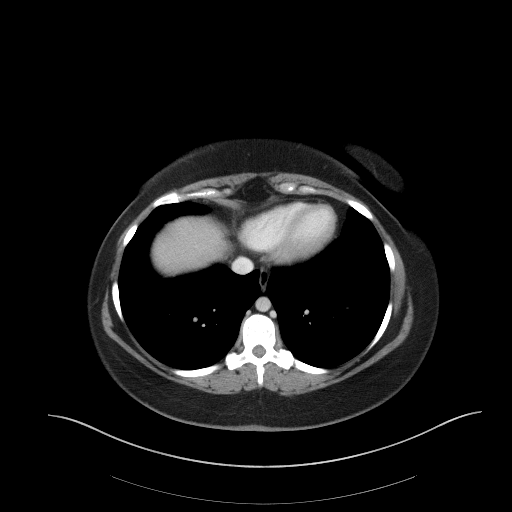
[im 46/58  lung]
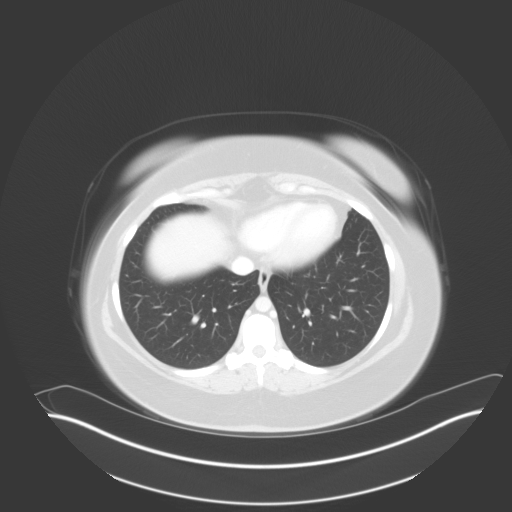
[im 49/58  soft-tissue]
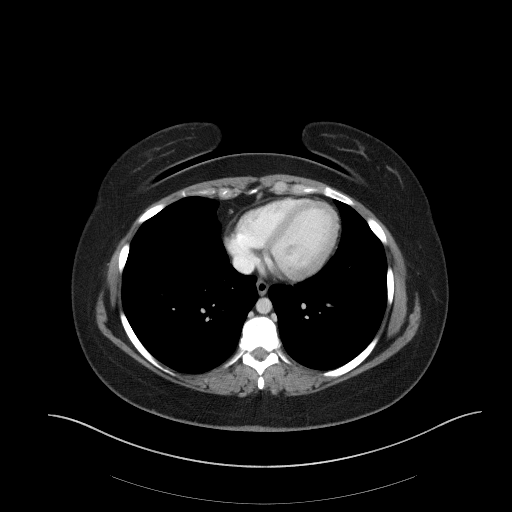
[im 49/58  lung]
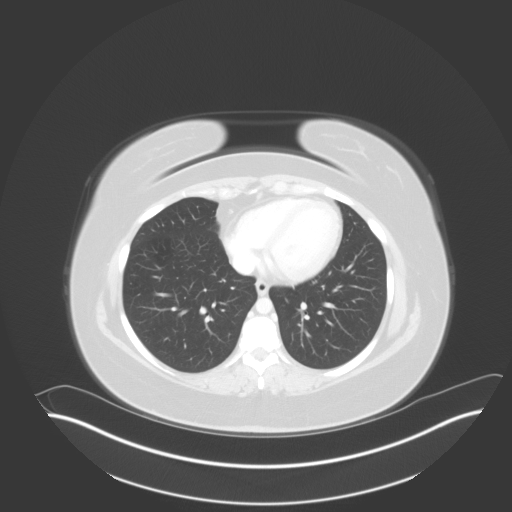
[im 52/58  lung]
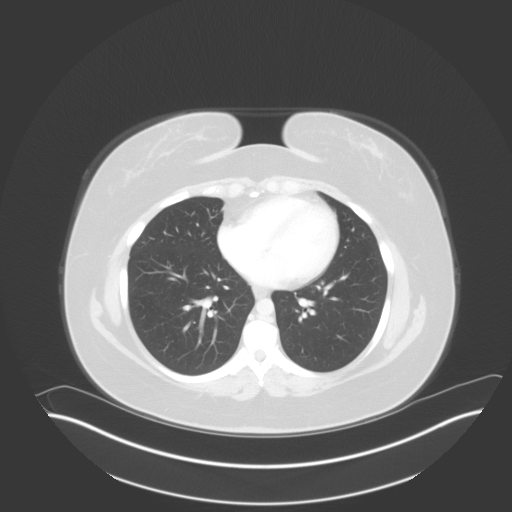
[im 55/58  soft-tissue]
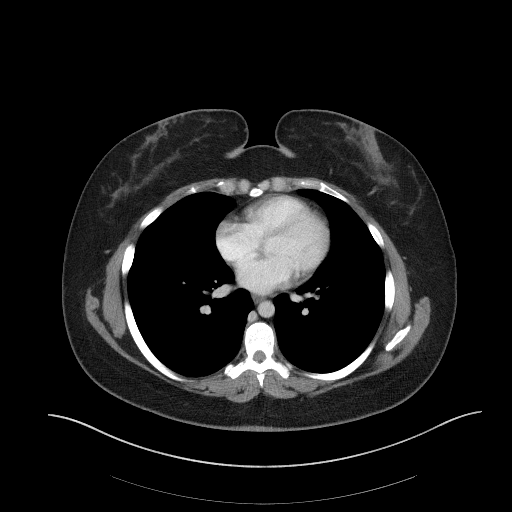
[im 55/58  lung]
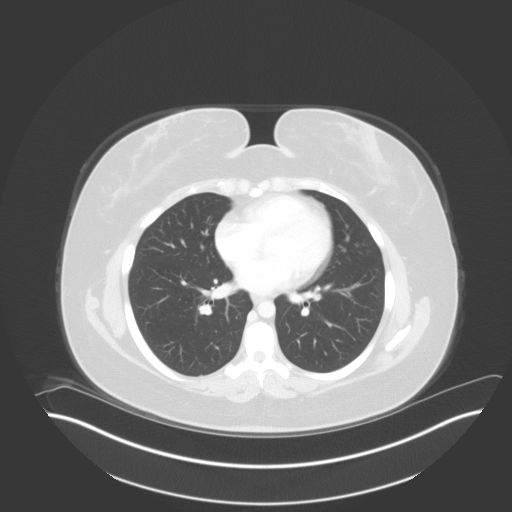

[14 of 32 positions shown; findings below may reference images not displayed]

FINDINGS: Lower chest: No acute abnormality.

Hepatobiliary: No suspicious hepatic lesion. Gallbladder is
unremarkable. No biliary ductal dilation.

Pancreas: No pancreatic ductal dilation or evidence of acute
inflammation.

Spleen: Within normal limits.  Splenule.

Adrenals/Urinary Tract: Bilateral adrenal glands are unremarkable.
No hydronephrosis. Symmetric enhancement of bilateral kidneys. No
solid enhancing renal mass.

Stomach/Bowel: Radiopaque enteric contrast material traverses the
descending colon. Stomach is unremarkable for degree of distension.
No pathologic dilation or evidence of acute inflammation involving
loops of small or large bowel in the abdomen. Visualized portions of
the appendix appear normal. Terminal ileum appears normal.

Vascular/Lymphatic: No abdominal aortic aneurysm. No pathologically
enlarged abdominal lymph nodes.

Other: No abnormality corresponding with the vitamin-E skin marker
seen overlying the left paramedian anterior abdomen on image [DATE].

Musculoskeletal: No acute or significant osseous findings.
IMPRESSION: No acute abnormality in the abdomen. Specifically no CT abnormality
visualized subjacent to the vitamin-E skin marker seen overlying the
left paramedian anterior abdomen, which corresponds with the
patient's palpable area of concern.

## 2023-05-22 ENCOUNTER — Encounter: Payer: Self-pay | Admitting: Family Medicine

## 2023-05-22 NOTE — Progress Notes (Unsigned)
   Annual physical  Subjective    Patient ID: LASHENA SIGNER, female    DOB: 09-Apr-1985  Age: 38 y.o. MRN: [id number]  No chief complaint on file.  HPI Melitta is a 38 y.o. old female here  for annual exam.   Work:*** Relationship:*** Children:*** Tobacco:*** Alcohol:*** Recreational drugs:*** Menstruation:*** Diet:*** Exercise:***  Family history of breast, ovarian, endometrial, colorectal cancer:***  Advance directive:***  Other providers:***   The patient has the following chronic health issues that are monitored on a routine basis: ***  HPI  Separate, acute concerns today: ***  The ASCVD Risk score (Arnett DK, et al., 2019) failed to calculate for the following reasons:   The 2019 ASCVD risk score is only valid for ages 11 to 35  Health Maintenance Due  Topic Date Due   COVID-19 Vaccine (3 - 2024-25 season) 10/23/2022      Objective:     There were no vitals taken for this visit. {Vitals History (Optional):23777}  Physical Exam   No results found for any visits on 05/23/23.      Assessment & Plan:   There are no diagnoses linked to this encounter.   No follow-ups on file.    Sandre Kitty, MD

## 2023-05-23 ENCOUNTER — Encounter: Payer: Self-pay | Admitting: Family Medicine

## 2023-05-23 ENCOUNTER — Ambulatory Visit (INDEPENDENT_AMBULATORY_CARE_PROVIDER_SITE_OTHER): Payer: Self-pay | Admitting: Family Medicine

## 2023-05-23 VITALS — BP 120/78 | HR 66 | Ht 62.0 in | Wt 202.0 lb

## 2023-05-23 DIAGNOSIS — G478 Other sleep disorders: Secondary | ICD-10-CM | POA: Diagnosis not present

## 2023-05-23 DIAGNOSIS — Z8669 Personal history of other diseases of the nervous system and sense organs: Secondary | ICD-10-CM

## 2023-05-23 DIAGNOSIS — Z0000 Encounter for general adult medical examination without abnormal findings: Secondary | ICD-10-CM

## 2023-05-23 DIAGNOSIS — Z6832 Body mass index (BMI) 32.0-32.9, adult: Secondary | ICD-10-CM | POA: Diagnosis not present

## 2023-05-23 NOTE — Assessment & Plan Note (Signed)
 No active treatment currently.  Symptoms mainly under control.  Continues to follow periodically with [facility].  Filled out form for medical exception for tinted windows today.

## 2023-05-23 NOTE — Assessment & Plan Note (Signed)
 Sees a nutritionist in [place].  Next appointment in October.

## 2023-05-23 NOTE — Patient Instructions (Addendum)
 It was nice to see you today,  We addressed the following topics today: I have filled out the form for your window tenting. - We will see back in 1 year or sooner if you have any acute concerns..  He  Have a great day,  Frederic Jericho, MD

## 2023-05-23 NOTE — Assessment & Plan Note (Signed)
 Diagnosed on previous sleep study.  Continue Flonase.

## 2023-05-25 IMAGING — DX DG HAND COMPLETE 3+V*R*
3 series · 3 of 3 positions shown · non-contrast
Comparison: None.

CLINICAL DATA: Dog bite.

EXAM:
RIGHT HAND - COMPLETE 3+ VIEW

[hand ap]
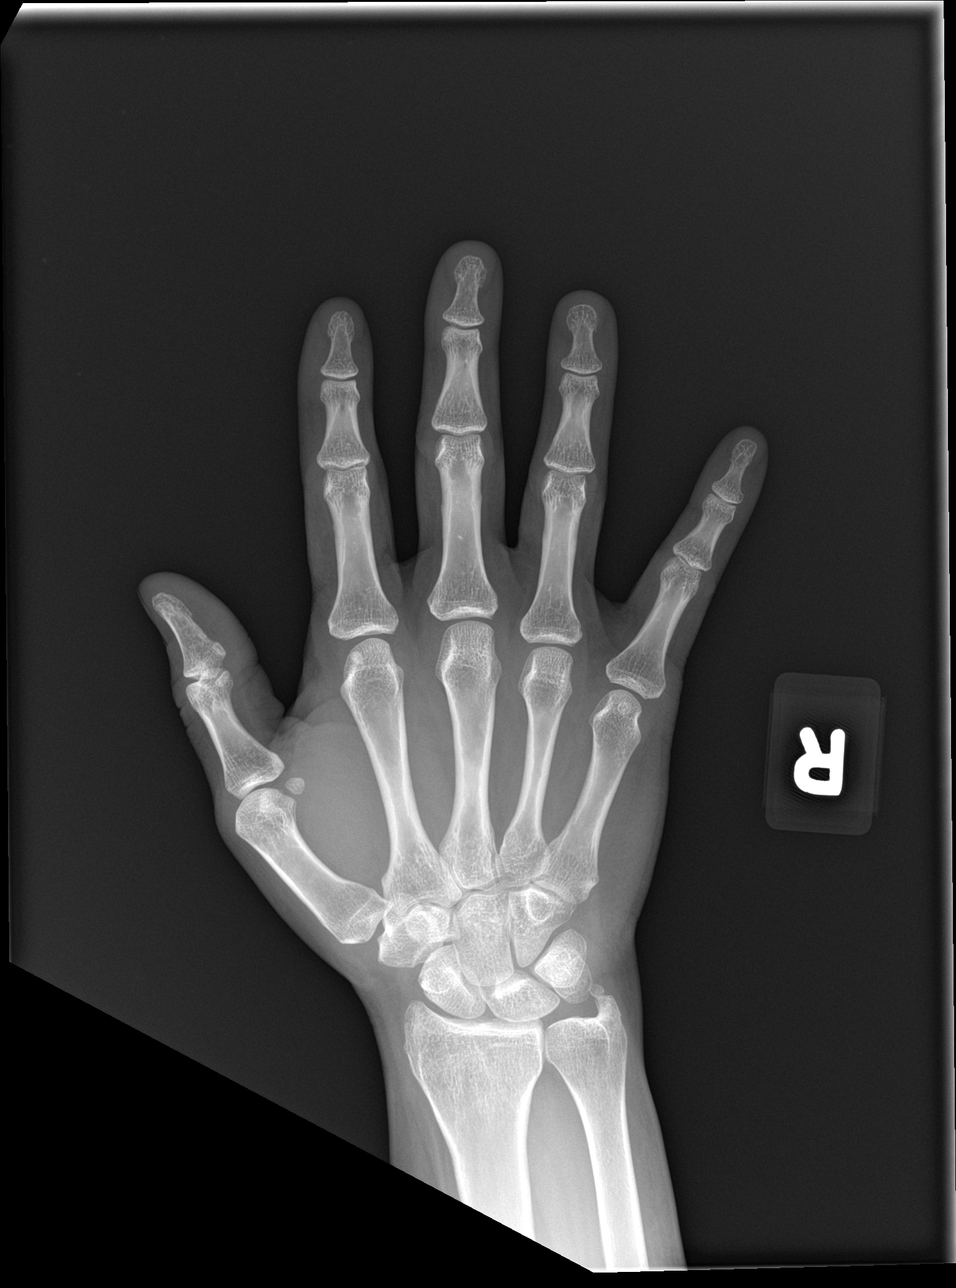

[hand obl]
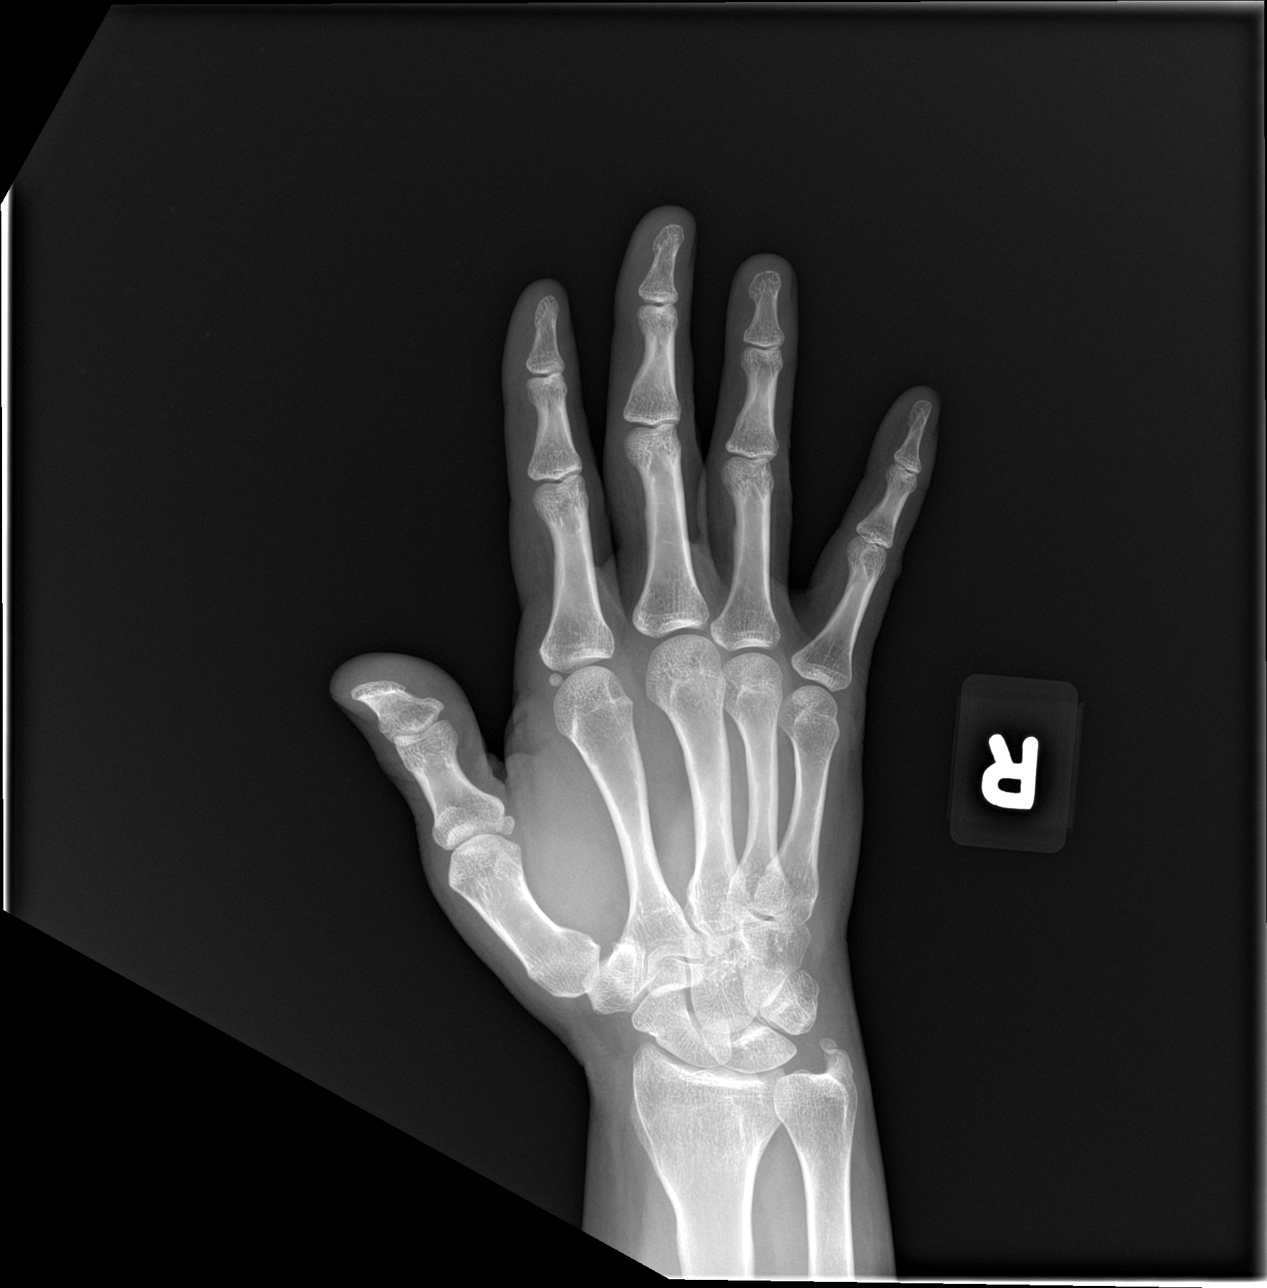

[hand lat]
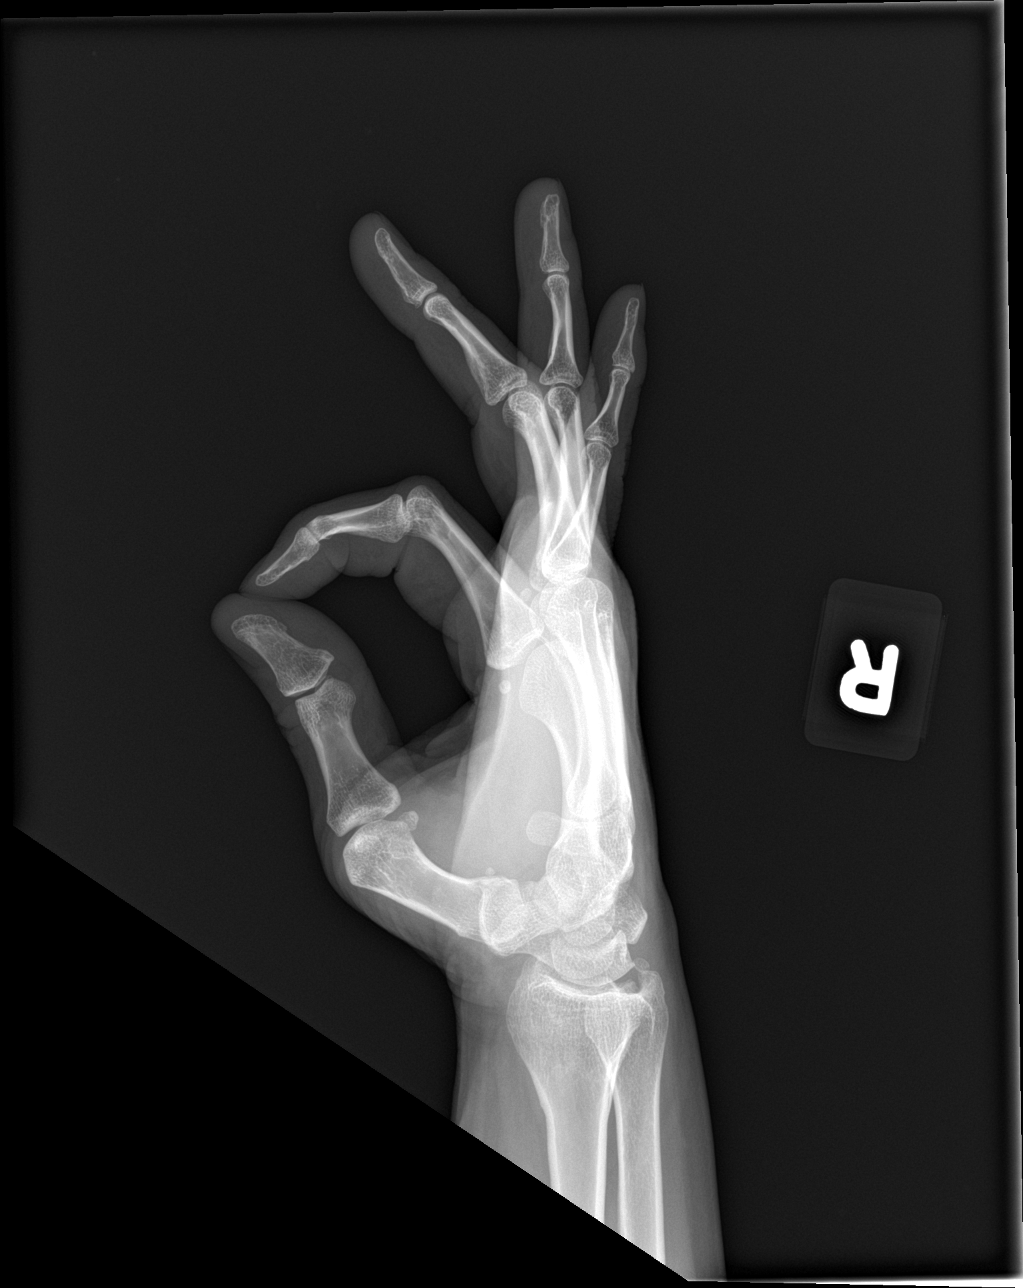

[3 of 3 positions shown; findings below may reference images not displayed]

FINDINGS: There is no evidence of fracture or dislocation. Well corticated
density distal to the ulna styloid may represent sequela of remote
injury or an accessory ossicle. There is no evidence of arthropathy
or other focal bone abnormality. No soft tissue gas. No radiopaque
foreign body. Soft tissues are unremarkable.
IMPRESSION: No acute fracture, dislocation, or radiopaque foreign body.

## 2023-07-14 ENCOUNTER — Encounter: Payer: Self-pay | Admitting: Family Medicine

## 2023-07-22 ENCOUNTER — Ambulatory Visit: Admission: EM | Admit: 2023-07-22 | Discharge: 2023-07-22 | Disposition: A | Discharge status: Home / Self Care

## 2023-07-22 DIAGNOSIS — H9202 Otalgia, left ear: Secondary | ICD-10-CM

## 2023-07-22 NOTE — Discharge Instructions (Addendum)
 There is no indication of an ear infection at this time.  Take Tylenol or ibuprofen as needed for discomfort.  Follow-up with your primary care provider if your symptoms are not improving.

## 2023-07-22 NOTE — ED Triage Notes (Signed)
 Patient to Urgent Care with complaints of left sided ear pain.  Symptoms x10 days. Denies any fevers.  Meds: no otcs meds.

## 2023-07-22 NOTE — ED Provider Notes (Signed)
 Penny Cooper    CSN: [id number] Arrival date & time: 07/22/23  1521      History   Chief Complaint Chief Complaint  Patient presents with   Otalgia    HPI Penny Cooper is a 38 y.o. female.  Patient presents with 10 day history of left ear pain.  No OTC medications taken today.  No fever, ear drainage, sore throat, cough, shortness of breath.  The history is provided by the patient and medical records.    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 Authorizing Provider  fluticasone (FLONASE) 50 MCG/ACT nasal spray Place into the nose. 05/21/19 08/19/20  [provider]  ibuprofen (ADVIL) 200 MG tablet Take 200 mg by mouth every 6 (six) hours as needed.    [provider]  levocetirizine (XYZAL) 5 MG tablet Take 5 mg by mouth daily. 05/18/20   [provider]  norethindrone-ethinyl estradiol-FE (JUNEL FE 1/20) 1-20 MG-MCG tablet Take 1 tablet by mouth daily. Patient not taking: Reported on 07/22/2023 08/11/20   Alise Appl, CNM    Family History Family History   Problem Relation Age of Onset   Diabetes Maternal Grandmother    Hypertension Maternal Grandmother    Cancer Maternal Grandfather    Heart attack Maternal Grandfather    Hypertension Paternal Grandfather    Stroke Paternal Grandfather     Social History Social History   Tobacco Use   Smoking status: Never   Smokeless tobacco: Never  Vaping Use   Vaping status: Never Used  Substance Use Topics   Alcohol use: Not Currently    Comment: occassionally 1-2 a yr   Drug use: Never     Allergies   Mango flavoring agent (non-screening)   Review of Systems Review of Systems  Constitutional:  Negative for chills and fever.  HENT:  Positive for ear pain. Negative for ear discharge and sore throat.   Respiratory:  Negative for cough and shortness of breath.      Physical Exam Triage Vital Signs ED Triage Vitals  Encounter Vitals Group     BP      Systolic BP Percentile      Diastolic BP Percentile      Pulse      Resp  Temp      Temp src      SpO2      Weight      Height      Head Circumference      Peak Flow      Pain Score      Pain Loc      Pain Education      Exclude from Growth Chart    No data found.  Updated Vital Signs BP 132/87   Pulse 99   Temp 98.3 F (36.8 C)   Resp 18   LMP 07/22/2023   SpO2 98%   Visual Acuity Right Eye Distance:   Left Eye Distance:   Bilateral Distance:    Right Eye Near:   Left Eye Near:    Bilateral Near:     Physical Exam Constitutional:      General: She is not in acute distress. HENT:     Right Ear: Tympanic membrane and ear canal normal.     Left Ear: Tympanic membrane and ear canal normal.     Nose: Nose normal.     Mouth/Throat:     Mouth: Mucous membranes are moist.     Pharynx: Oropharynx is clear.  Cardiovascular:     Rate and Rhythm: Normal rate and regular rhythm.     Heart sounds: Normal heart sounds.  Pulmonary:     Effort: Pulmonary effort is normal. No respiratory distress.     Breath  sounds: Normal breath sounds.  Neurological:     Mental Status: She is alert.      UC Treatments / Results  Labs (all labs ordered are listed, but only abnormal results are displayed) Labs Reviewed - No data to display  EKG   Radiology No results found.  Procedures Procedures (including critical care time)  Medications Ordered in UC Medications - No data to display  Initial Impression / Assessment and Plan / UC Course  I have reviewed the triage vital signs and the nursing notes.  Pertinent labs & imaging results that were available during my care of the patient were reviewed by me and considered in my medical decision making (see chart for details).    Left otalgia.  Afebrile and vital signs are stable.  No indication of ear infection at this time.  Instructed patient to take Tylenol or ibuprofen as needed.  Education provided on earache.  Instructed patient to follow up with her PCP if her symptoms are not improving.  She agrees to plan of care.  Final Clinical Impressions(s) / UC Diagnoses   Final diagnoses:  Otalgia, left ear     Discharge Instructions      There is no indication of an ear infection at this time.  Take Tylenol or ibuprofen as needed for discomfort.  Follow-up with your primary care provider if your symptoms are not improving.    ED Prescriptions   None    PDMP not reviewed this encounter.   Wellington Half, NP 07/22/23 1540

## 2024-01-22 ENCOUNTER — Ambulatory Visit: Admitting: Obstetrics

## 2024-02-05 ENCOUNTER — Ambulatory Visit: Admitting: Obstetrics & Gynecology

## 2024-03-13 NOTE — Progress Notes (Unsigned)
 "   GYNECOLOGY ANNUAL PHYSICAL EXAM NOTE  Subjective:    Penny Cooper is a 39 y.o. G58P3003 female who presents for an annual exam.  The patient {is/is not/has never been:13135} sexually active. The patient participates in regular exercise: {yes/no/not asked:9010}. Has the patient ever been transfused or tattooed?: {yes/no/not asked:9010}. The patient reports that there {is/is not:9024} domestic violence in her life. The patient has completed the Gardasil vaccine: {yes/no:311178}  The patient has the following complaints today: ***  Menstrual History: Menarche age: *** No LMP recorded.     Gynecologic History:  Contraception: {method:5051} History of STI's:  Last Pap: 08/11/2020. Results were: normal.   History of abnormal pap(s): *** Last mammogram: NA       OB History  Gravida Para Term Preterm AB Living  3 3 3  0 0 3  SAB IAB Ectopic Multiple Live Births  0 0 0 0 0    # Outcome Date GA Lbr Len/2nd Weight Sex Type Anes PTL Lv  3 Term           2 Term           1 Term             Past Medical History:  Diagnosis Date   Allergy    Anxiety    Neuromuscular disorder (HCC)    Optic neuritis     Past Surgical History:  Procedure Laterality Date   TONSILLECTOMY AND ADENOIDECTOMY     WISDOM TOOTH EXTRACTION      Family History  Problem Relation Age of Onset   Diabetes Maternal Grandmother    Hypertension Maternal Grandmother    Cancer Maternal Grandfather    Heart attack Maternal Grandfather    Hypertension Paternal Grandfather    Stroke Paternal Grandfather     Social History   Socioeconomic History   Marital status: Married    Spouse name: Not on file   Number of children: Not on file   Years of education: Not on file   Highest education level: Bachelor's degree (e.g., BA, AB, BS)  Occupational History   Not on file  Tobacco Use   Smoking status: Never   Smokeless tobacco: Never  Vaping Use   Vaping status: Never Used  Substance and  Sexual Activity   Alcohol use: Not Currently    Comment: occassionally 1-2 a yr   Drug use: Never   Sexual activity: Yes    Birth control/protection: Coitus interruptus, Condom, Pill, Rhythm, None  Other Topics Concern   Not on file  Social History Narrative   Not on file   Social Drivers of Health   Tobacco Use: Low Risk (07/22/2023)   Patient History    Smoking Tobacco Use: Never    Smokeless Tobacco Use: Never    Passive Exposure: Not on file  Financial Resource Strain: Low Risk (05/22/2023)   Overall Financial Resource Strain (CARDIA)    Difficulty of Paying Living Expenses: Not very hard  Food Insecurity: No Food Insecurity (05/22/2023)   Hunger Vital Sign    Worried About Running Out of Food in the Last Year: Never true    Ran Out of Food in the Last Year: Never true  Transportation Needs: No Transportation Needs (05/22/2023)   PRAPARE - Administrator, Civil Service (Medical): No    Lack of Transportation (Non-Medical): No  Physical Activity: Insufficiently Active (05/22/2023)   Exercise Vital Sign    Days of Exercise per Week: 4 days  Minutes of Exercise per Session: 30 min  Stress: Stress Concern Present (05/22/2023)   [employer] of Occupational Health - Occupational Stress Questionnaire    Feeling of Stress : Rather much  Social Connections: Socially Isolated (05/22/2023)   Social Connection and Isolation Panel    Frequency of Communication with Friends and Family: Once a week    Frequency of Social Gatherings with Friends and Family: Once a week    Attends Religious Services: Never    Database Administrator or Organizations: No    Attends Engineer, Structural: Not on file    Marital Status: Married  Catering Manager Violence: Not on file  Depression (PHQ2-9): Medium Risk (05/23/2023)   Depression (PHQ2-9)    PHQ-2 Score: 6  Alcohol Screen: Low Risk (05/22/2023)   Alcohol Screen    Last Alcohol Screening Score (AUDIT): 1  Housing: Low  Risk (05/22/2023)   Housing Stability Vital Sign    Unable to Pay for Housing in the Last Year: No    Number of Times Moved in the Last Year: 0    Homeless in the Last Year: No  Utilities: Low Risk (05/28/2022)   Received from [facility]   Utilities    Within the past 12 months, have you been unable to get utilities(heat, electricity) when it was really needed?: No  Health Literacy: Not on file    Medications Ordered Prior to Encounter[1]  Allergies[2]   Review of Systems Constitutional: negative for chills, fatigue, fevers and sweats Eyes: negative for irritation, redness and visual disturbance Ears, nose, mouth, throat, and face: negative for hearing loss, nasal congestion, snoring and tinnitus Respiratory: negative for asthma, cough, sputum Cardiovascular: negative for chest pain, dyspnea, exertional chest pressure/discomfort, irregular heart beat, palpitations and syncope Gastrointestinal: negative for abdominal pain, change in bowel habits, nausea and vomiting Genitourinary: negative for abnormal menstrual periods, genital lesions, sexual problems and vaginal discharge, dysuria and urinary incontinence Integument/breast: negative for breast lump, breast tenderness and nipple discharge Hematologic/lymphatic: negative for bleeding and easy bruising Musculoskeletal:negative for back pain and muscle weakness Neurological: negative for dizziness, headaches, vertigo and weakness Endocrine: negative for diabetic symptoms including polydipsia, polyuria and skin dryness Allergic/Immunologic: negative for hay fever and urticaria      Objective:  There were no vitals taken for this visit. There is no height or weight on file to calculate BMI.    General Appearance:    Alert, cooperative, no distress, appears stated age  Head:    Normocephalic, without obvious abnormality, atraumatic  Eyes:    Conjunctiva/corneas clear, EOMs intact bilaterally  Ears:    Normal external ear canals  bilaterally  Nose:   Nares normal  Throat:   Lips, mucosa, and tongue normal; teeth and gums normal  Neck:   Supple, symmetrical, trachea midline, no adenopathy; thyroid: no enlargement/tenderness/nodules; no carotid bruit or JVD  Back:     ROM normal, no CVA tenderness  Lungs:     Clear to auscultation bilaterally, respirations unlabored  Chest Wall:    No tenderness or deformity   Heart:    Regular rate and rhythm, S1 and S2 normal, no appreciated murmur, rub or gallop  Breast Exam:    No tenderness, masses, or nipple abnormality; no skin retraction, dimpling or nipple discharge.  Abdomen:     Soft, non-tender, bowel sounds active all four quadrants, no masses, no organomegaly.    Genitalia:    Pelvic: external genitalia normal, vagina without lesions, discharge, or tenderness,  rectovaginal septum  normal. Cervix normal in appearance, no cervical motion tenderness, no adnexal masses or tenderness.  Uterus normal size, shape, mobile, regular contours, nontender.  Rectal:    Normal external sphincter.  No hemorrhoids appreciated. Internal exam not done.   Extremities:   Extremities normal, atraumatic, no cyanosis or edema  Pulses:   2+ and symmetric all extremities  Skin:   Skin color, texture, turgor normal, no rashes or lesions  Lymph nodes:   Cervical, supraclavicular, and axillary nodes normal  Neurologic:   CNII-XII grossly intact, normal strength, sensation and reflexes throughout   .  Labs:  Lab Results  Component Value Date   WBC 9.3 01/05/2021   HGB 15.0 01/05/2021   HCT 44.3 01/05/2021   MCV 92 01/05/2021   PLT 285 01/05/2021    Lab Results  Component Value Date   CREATININE 0.66 01/05/2021   BUN 8 01/05/2021   NA 143 01/05/2021   K 4.0 01/05/2021   CL 102 01/05/2021   CO2 24 01/05/2021    Lab Results  Component Value Date   ALT 15 01/05/2021   AST 15 01/05/2021   ALKPHOS 97 01/05/2021   BILITOT 0.4 01/05/2021    Lab Results  Component Value Date   TSH  1.090 06/02/2020     Assessment:   No diagnosis found.   Plan:   TIFFIANY BEADLES is a 39 y.o. G73P3003 female here today for her annual exam, doing well.  Pap: done with cotesting today  *** w/rflx today Mammogram: ordered***due *** Labs: ***A1C, CMP, HepC, Lipid panel, Vit D, TSH PHQ-2 = ***, discussed coping techniques; RTC if worsens or develops concern Contraception: *** Vaccines: Gardasil complete Healthy lifestyle modifications discussed: multivitamin, diet, exercise, sunscreen. Emphasized importance of regular physical activity.  Folic Acid recommendation reviewed.  All questions answered to patient's satisfaction.   Follow up 1 yr for annual, sooner prn.    Estil Mangle, DO [facility]    [1]  Current Outpatient Medications on File Prior to Visit  Medication Sig Dispense Refill   fluticasone (FLONASE) 50 MCG/ACT nasal spray Place into the nose.     ibuprofen (ADVIL) 200 MG tablet Take 200 mg by mouth every 6 (six) hours as needed.     levocetirizine (XYZAL) 5 MG tablet Take 5 mg by mouth daily.     norethindrone-ethinyl estradiol-FE (JUNEL FE 1/20) 1-20 MG-MCG tablet Take 1 tablet by mouth daily. (Patient not taking: Reported on 07/22/2023) 84 tablet 11   No current facility-administered medications on file prior to visit.  [2]  Allergies Allergen Reactions   Mango Flavoring Agent (Non-Screening) Hives   "

## 2024-03-19 ENCOUNTER — Ambulatory Visit: Admitting: Obstetrics

## 2024-03-19 DIAGNOSIS — Z124 Encounter for screening for malignant neoplasm of cervix: Secondary | ICD-10-CM

## 2024-03-19 DIAGNOSIS — Z01419 Encounter for gynecological examination (general) (routine) without abnormal findings: Secondary | ICD-10-CM

## 2024-03-28 NOTE — Patient Instructions (Incomplete)
 Preventive Care 28-37 Years Old, Female  Preventive care refers to lifestyle choices and visits with your health care provider that can promote health and wellness. Preventive care visits are also called wellness exams. What can I expect for my preventive care visit? Counseling During your preventive care visit, your health care provider may ask about your: Medical history, including: Past medical problems. Family medical history. Pregnancy history. Current health, including: Menstrual cycle. Method of birth control. Emotional well-being. Home life and relationship well-being. Sexual activity and sexual health. Lifestyle, including: Alcohol, nicotine or tobacco, and drug use. Access to firearms. Diet, exercise, and sleep habits. Work and work Astronomer. Sunscreen use. Safety issues such as seatbelt and bike helmet use. Physical exam Your health care provider may check your: Height and weight. These may be used to calculate your BMI (body mass index). BMI is a measurement that tells if you are at a healthy weight. Waist circumference. This measures the distance around your waistline. This measurement also tells if you are at a healthy weight and may help predict your risk of certain diseases, such as type 2 diabetes and high blood pressure. Heart rate and blood pressure. Body temperature. Skin for abnormal spots. What immunizations do I need?  Vaccines are usually given at various ages, according to a schedule. Your health care provider will recommend vaccines for you based on your age, medical history, and lifestyle or other factors, such as travel or where you work. What tests do I need? Screening Your health care provider may recommend screening tests for certain conditions. This may include: Pelvic exam and Pap test. Lipid and cholesterol levels. Diabetes screening. This is done by checking your blood sugar (glucose) after you have not eaten for a while  (fasting). Hepatitis B test. Hepatitis C test. HIV (human immunodeficiency virus) test. STI (sexually transmitted infection) testing, if you are at risk. BRCA-related cancer screening. This may be done if you have a family history of breast, ovarian, tubal, or peritoneal cancers. Talk with your health care provider about your test results, treatment options, and if necessary, the need for more tests. Follow these instructions at home: Eating and drinking  Eat a healthy diet that includes fresh fruits and vegetables, whole grains, lean protein, and low-fat dairy products. Take vitamin and mineral supplements as recommended by your health care provider. Do not drink alcohol if: Your health care provider tells you not to drink. You are pregnant, may be pregnant, or are planning to become pregnant. If you drink alcohol: Limit how much you have to 0-1 drink a day. Know how much alcohol is in your drink. In the [place] one drink equals one 12 oz bottle of beer (355 mL), one 5 oz glass of wine (148 mL), or one 1 oz glass of hard liquor (44 mL). Lifestyle Brush your teeth every morning and night with fluoride toothpaste. Floss one time each day. Exercise for at least 30 minutes 5 or more days each week. Do not use any products that contain nicotine or tobacco. These products include cigarettes, chewing tobacco, and vaping devices, such as e-cigarettes. If you need help quitting, ask your health care provider. Do not use drugs. If you are sexually active, practice safe sex. Use a condom or other form of protection to prevent STIs. If you do not wish to become pregnant, use a form of birth control. If you plan to become pregnant, see your health care provider for a prepregnancy visit. Find healthy ways to manage stress, such as:  Meditation, yoga, or listening to music. Journaling. Talking to a trusted person. Spending time with friends and family. Minimize exposure to UV radiation to reduce your  risk of skin cancer. Safety Always wear your seat belt while driving or riding in a vehicle. Do not drive: If you have been drinking alcohol. Do not ride with someone who has been drinking. If you have been using any mind-altering substances or drugs. While texting. When you are tired or distracted. Wear a helmet and other protective equipment during sports activities. If you have firearms in your house, make sure you follow all gun safety procedures. Seek help if you have been physically or sexually abused. What's next? Go to your health care provider once a year for an annual wellness visit. Ask your health care provider how often you should have your eyes and teeth checked. Stay up to date on all vaccines. This information is not intended to replace advice given to you by your health care provider. Make sure you discuss any questions you have with your health care provider. Document Revised: 08/05/2020 Document Reviewed: 08/05/2020 Elsevier Patient Education  2024 Else[employer] Inc.     How to Do a Breast Self-Exam Doing breast self-exams can help you stay healthy. They're one way to know what's normal for your breasts. They can help you catch a problem while it's still small and can be treated. You need to: Check your breasts often. Tell your doctor about any changes. You should do breast self-exams even if you have breast implants. What you need: A mirror. A well-lit room. A pillow or other soft object. How to do a breast self-exam Look for changes  Take off all the clothes above your waist. Stand in front of a mirror in a room with good lighting. Put your hands down at your sides. Compare your breasts in the mirror. Look for difference between them, such as: Differences in shape. Differences in size. Wrinkles, dips, and bumps in one breast and not the other. Look at each breast for skin changes, such as: Redness. Scaly spots. Spots where your skin is  thicker. Dimpling. Open sores. Look for changes in your nipples, such as: Fluid coming out of a nipple. Fluid around a nipple. Bleeding. Dimpling. Redness. A nipple that looks pushed in or that has changed position. Feel for changes Lie on your back. Feel each breast. To do this: Pick a breast to feel. Place a pillow under the shoulder closest to that breast. Put the arm closest to that breast behind your head. Feel the breast using the hand of your other arm. Use the pads of your three middle fingers to make small circles starting near the nipple. Use light, medium, and firm pressure. Keep making circles, moving down over the breast. Stop when you feel your ribs. Start making circles with your fingers again, this time going up until you reach your collarbone. Then, make circles out across your breast and into your armpit area. Squeeze your nipple. Check for fluid and lumps. Do these steps again to check your other breast. Sit or stand in the tub or shower. With soapy water on your skin, feel each breast the same way you did when you were lying down. Write down what you find Writing down what you find can help you keep track of what you want to tell your doctor. Write down: What's normal for each breast. Any changes you find. Write down: The kind of change. If your breast feels tender or painful.  Any lump you find. Write down its size and where it is. When you last had your period. General tips If you're breastfeeding, the best time to check your breasts is after you feed your baby or after you use a breast pump. If you get a period, the best time to check your breasts is 5-7 days after your period ends. With time, you'll get more used to doing the self-exam. You'll also start to know if there are changes in your breasts. Contact a doctor if: You see a change in the shape or size of your breasts or nipples. You see a change in the skin of your breast or nipples. You have fluid  coming from your nipples that isn't normal. You find a new lump or thick area. You have breast pain. You have any concerns about your breast health. This information is not intended to replace advice given to you by your health care provider. Make sure you discuss any questions you have with your health care provider. Document Revised: 04/19/2023 Document Reviewed: 04/19/2023 Elsevier Patient Education  2025 [employer].
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
## 2024-04-02 ENCOUNTER — Ambulatory Visit: Admitting: Certified Nurse Midwife

## 2024-04-02 DIAGNOSIS — Z131 Encounter for screening for diabetes mellitus: Secondary | ICD-10-CM

## 2024-04-02 DIAGNOSIS — Z01419 Encounter for gynecological examination (general) (routine) without abnormal findings: Secondary | ICD-10-CM

## 2024-04-02 DIAGNOSIS — Z124 Encounter for screening for malignant neoplasm of cervix: Secondary | ICD-10-CM

## 2024-04-02 DIAGNOSIS — Z13220 Encounter for screening for lipoid disorders: Secondary | ICD-10-CM

## 2024-04-02 DIAGNOSIS — Z1329 Encounter for screening for other suspected endocrine disorder: Secondary | ICD-10-CM

## 2024-05-10 ENCOUNTER — Other Ambulatory Visit

## 2024-05-14 ENCOUNTER — Encounter: Admitting: Family Medicine

## 2024-05-15 ENCOUNTER — Other Ambulatory Visit

## 2024-05-22 ENCOUNTER — Encounter: Admitting: Family Medicine
